# Patient Record
Sex: Female | Born: 1995 | Race: Black or African American | Hispanic: No | Marital: Single | Smoking: Never smoker
Health system: Southern US, Community
[De-identification: ages and names within clinical notes are randomized; demographics above are authoritative.]

## PROBLEM LIST (undated history)

## (undated) DIAGNOSIS — J45909 Unspecified asthma, uncomplicated: Secondary | ICD-10-CM

## (undated) DIAGNOSIS — F419 Anxiety disorder, unspecified: Secondary | ICD-10-CM

## (undated) HISTORY — DX: Anxiety disorder, unspecified: F41.9

---
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
## 2016-01-20 ENCOUNTER — Emergency Department (HOSPITAL_COMMUNITY)
Admission: EM | Admit: 2016-01-20 | Discharge: 2016-01-21 | Disposition: A | Discharge status: Home / Self Care | Payer: Medicaid Other | Attending: Emergency Medicine | Admitting: Emergency Medicine

## 2016-01-20 ENCOUNTER — Encounter (HOSPITAL_COMMUNITY): Payer: Self-pay | Admitting: Emergency Medicine

## 2016-01-20 ENCOUNTER — Emergency Department (HOSPITAL_COMMUNITY): Payer: Medicaid Other

## 2016-01-20 DIAGNOSIS — F1210 Cannabis abuse, uncomplicated: Secondary | ICD-10-CM | POA: Diagnosis not present

## 2016-01-20 DIAGNOSIS — G43809 Other migraine, not intractable, without status migrainosus: Secondary | ICD-10-CM | POA: Diagnosis not present

## 2016-01-20 DIAGNOSIS — G43109 Migraine with aura, not intractable, without status migrainosus: Secondary | ICD-10-CM

## 2016-01-20 DIAGNOSIS — J45909 Unspecified asthma, uncomplicated: Secondary | ICD-10-CM | POA: Insufficient documentation

## 2016-01-20 DIAGNOSIS — Z3202 Encounter for pregnancy test, result negative: Secondary | ICD-10-CM | POA: Diagnosis not present

## 2016-01-20 DIAGNOSIS — R202 Paresthesia of skin: Secondary | ICD-10-CM | POA: Diagnosis not present

## 2016-01-20 DIAGNOSIS — H538 Other visual disturbances: Secondary | ICD-10-CM

## 2016-01-20 DIAGNOSIS — Z79899 Other long term (current) drug therapy: Secondary | ICD-10-CM | POA: Insufficient documentation

## 2016-01-20 LAB — CBC
HCT: 39.6 % (ref 36.0–46.0)
Hemoglobin: 12.8 g/dL (ref 12.0–15.0)
MCH: 27.2 pg (ref 26.0–34.0)
MCHC: 32.3 g/dL (ref 30.0–36.0)
MCV: 84.1 fL (ref 78.0–100.0)
PLATELETS: 215 10*3/uL (ref 150–400)
RBC: 4.71 MIL/uL (ref 3.87–5.11)
RDW: 13.2 % (ref 11.5–15.5)
WBC: 7.5 10*3/uL (ref 4.0–10.5)

## 2016-01-20 LAB — I-STAT CHEM 8, ED
BUN: 13 mg/dL (ref 6–20)
CALCIUM ION: 1.11 mmol/L — AB (ref 1.12–1.23)
CHLORIDE: 103 mmol/L (ref 101–111)
Creatinine, Ser: 0.8 mg/dL (ref 0.44–1.00)
GLUCOSE: 97 mg/dL (ref 65–99)
HCT: 44 % (ref 36.0–46.0)
HEMOGLOBIN: 15 g/dL (ref 12.0–15.0)
Potassium: 3.5 mmol/L (ref 3.5–5.1)
SODIUM: 144 mmol/L (ref 135–145)
TCO2: 26 mmol/L (ref 0–100)

## 2016-01-20 LAB — DIFFERENTIAL
BASOS ABS: 0 10*3/uL (ref 0.0–0.1)
BASOS PCT: 0 %
Eosinophils Absolute: 0.1 10*3/uL (ref 0.0–0.7)
Eosinophils Relative: 1 %
LYMPHS PCT: 52 %
Lymphs Abs: 3.9 10*3/uL (ref 0.7–4.0)
Monocytes Absolute: 0.5 10*3/uL (ref 0.1–1.0)
Monocytes Relative: 7 %
NEUTROS ABS: 3 10*3/uL (ref 1.7–7.7)
NEUTROS PCT: 40 %

## 2016-01-20 LAB — I-STAT TROPONIN, ED: TROPONIN I, POC: 0 ng/mL (ref 0.00–0.08)

## 2016-01-20 LAB — I-STAT BETA HCG BLOOD, ED (MC, WL, AP ONLY): I-stat hCG, quantitative: <5 m[IU]/mL (ref <5)

## 2016-01-20 MED ORDER — SODIUM CHLORIDE 0.9 % IV BOLUS (SEPSIS)
1000.0000 mL | Freq: Once | INTRAVENOUS | Status: AC
  Administered 2016-01-21: 1000 mL via INTRAVENOUS

## 2016-01-20 MED ORDER — METOCLOPRAMIDE HCL 5 MG/ML IJ SOLN
10.0000 mg | Freq: Once | INTRAMUSCULAR | Status: AC
  Administered 2016-01-21: 10 mg via INTRAVENOUS
  Filled 2016-01-20: qty 2

## 2016-01-20 NOTE — ED Notes (Signed)
Pt to CT

## 2016-01-20 NOTE — ED Notes (Signed)
Pt c/o L side blurred vision, L arm and L leg numbness/tingling that started at 2150. Hx of migraines but sts this is different. No drift or facial droop noted. Airway cleared by Dr. Criss AlvineGoldston

## 2016-01-20 NOTE — ED Notes (Signed)
Code stroke paged @ 2317

## 2016-01-20 NOTE — ED Notes (Signed)
Neurologist at bedside. Pt taken to tx room

## 2016-01-20 NOTE — ED Provider Notes (Signed)
CSN: [id number]     Arrival date & time 01/20/16  2304 History   First MD Initiated Contact with Patient 01/20/16 2331     Chief Complaint  Patient presents with  . Code Stroke    Patricia Webster is a 20 y.o. female who presents to the ED complaining of her left field of vision being blurry since around 9 pm tonight with some left arm and left leg tingling that has become worse since her vision became blurry. Code stroke was called in triage. The patient reports a history of migraines reports this feels different. She denies any headache. She denies any double vision. She denies any trauma or injury to her head. She has taken nothing for treatment today. Patient reports she is been having some chronic ongoing nausea for the past several months that is unchanged. She denies any vomiting. Patient is on Nexplanon for birth control. She is currently on her menstrual cycle. She denies fevers, abdominal pain vomiting, diarrhea, double vision, neck pain, head injury, urinary symptoms, chest pain, or rashes.   The history is provided by the patient. No language interpreter was used.    Past Medical History  Diagnosis Date  . Asthma    History reviewed. No pertinent past surgical history. No family history on file. Social History  Substance Use Topics  . Smoking status: Never Smoker   . Smokeless tobacco: None  . Alcohol Use: No   OB History    No data available     Review of Systems  Constitutional: Negative for fever and chills.  HENT: Negative for congestion, hearing loss, sore throat and trouble swallowing.   Eyes: Positive for visual disturbance. Negative for pain and redness.  Respiratory: Negative for cough, shortness of breath and wheezing.   Cardiovascular: Negative for chest pain.  Gastrointestinal: Positive for nausea. Negative for vomiting, abdominal pain and diarrhea.  Genitourinary: Negative for dysuria.  Musculoskeletal: Negative for back pain, neck pain and neck stiffness.   Skin: Negative for rash.  Neurological: Negative for dizziness, seizures, syncope, facial asymmetry, speech difficulty, weakness, light-headedness, numbness and headaches.       Left arm and leg tingling.       Allergies  Review of patient's allergies indicates no known allergies.  Home Medications   Prior to Admission medications   Medication Sig Start Date End Date Taking? Authorizing Provider  albuterol (PROVENTIL HFA;VENTOLIN HFA) 108 (90 BASE) MCG/ACT inhaler Inhale 1-2 puffs into the lungs every 6 (six) hours as needed for wheezing. 08/24/13  Yes Linna Hoff, MD  etonogestrel (NEXPLANON) 68 MG IMPL implant 1 each by Subdermal route once.   Yes Historical Provider, MD  ibuprofen (ADVIL,MOTRIN) 600 MG tablet Take 1 tablet (600 mg total) by mouth every 6 (six) hours as needed for mild pain. 05/09/15  Yes Marcellina Millin, MD  ondansetron (ZOFRAN-ODT) 4 MG disintegrating tablet Take 1 tablet (4 mg total) by mouth every 8 (eight) hours as needed for nausea or vomiting. 05/09/15  Yes Marcellina Millin, MD  amitriptyline (ELAVIL) 25 MG tablet Take 1 tablet (25 mg total) by mouth at bedtime. 01/21/16   Everlene Farrier, PA-C   BP 119/76 mmHg  Pulse 71  Temp(Src) 98.5 F (36.9 C) (Oral)  Resp 16  Ht  (1.778 m)  Wt 101.152 kg  BMI 32.00 kg/m2  SpO2 100%  LMP 01/06/2016 Physical Exam  Constitutional: She is oriented to person, place, and time. She appears well-developed and well-nourished. No distress.  Nontoxic appearing.  HENT:  Head: Normocephalic and atraumatic.  Right Ear: External ear normal.  Left Ear: External ear normal.  Mouth/Throat: Oropharynx is clear and moist.  Eyes: Conjunctivae and EOM are normal. Pupils are equal, round, and reactive to light. Right eye exhibits no discharge. Left eye exhibits no discharge.  She reports a left field of vision is blurry in both eyes.   Neck: Normal range of motion. Neck supple. No JVD present. No tracheal deviation present.   Cardiovascular: Normal rate, regular rhythm, normal heart sounds and intact distal pulses.  Exam reveals no gallop and no friction rub.   No murmur heard. Bilateral radial and posterior tibialis pulses are intact.  Pulmonary/Chest: Effort normal and breath sounds normal. No respiratory distress. She has no wheezes. She has no rales.  Lungs clear to auscultation bilaterally.  Abdominal: Soft. She exhibits no distension. There is no tenderness. There is no guarding.  Abdomen is soft and nontender to palpation.  Musculoskeletal: Normal range of motion. She exhibits no edema.  Patient has 5 out of 5 strength in bilateral upper and lower extremities. Normal gait.  Lymphadenopathy:    She has no cervical adenopathy.  Neurological: She is alert and oriented to person, place, and time. No cranial nerve deficit. Coordination normal.  Patient is alert and oriented 3. EOMs are intact. She reports blurry left field of vision in both eyes. Cranial nerves are otherwise intact. Sensation is intact for bilateral upper and lower extremities. No pronator drift. Finger to nose intact bilaterally. Normal gait. Speech is clear and coherent.  Skin: Skin is warm and dry. No rash noted. She is not diaphoretic. No erythema. No pallor.  Psychiatric: She has a normal mood and affect. Her behavior is normal.  Nursing note and vitals reviewed.   ED Course  Procedures (including critical care time) Labs Review Labs Reviewed  COMPREHENSIVE METABOLIC PANEL - Abnormal; Notable for the following:    Glucose, Bld 102 (*)    All other components within normal limits  URINE RAPID DRUG SCREEN, HOSP PERFORMED - Abnormal; Notable for the following:    Tetrahydrocannabinol POSITIVE (*)    All other components within normal limits  URINALYSIS, ROUTINE W REFLEX MICROSCOPIC (NOT AT [facility]) - Abnormal; Notable for the following:    Hgb urine dipstick LARGE (*)    All other components within normal limits  URINE MICROSCOPIC-ADD  ON - Abnormal; Notable for the following:    Squamous Epithelial / LPF 0-5 (*)    Bacteria, UA RARE (*)    All other components within normal limits  I-STAT CHEM 8, ED - Abnormal; Notable for the following:    Calcium, Ion 1.11 (*)    All other components within normal limits  ETHANOL  PROTIME-INR  APTT  CBC  DIFFERENTIAL  I-STAT TROPOININ, ED  I-STAT BETA HCG BLOOD, ED ([facility], [facility], [facility] ONLY)    Imaging Review Ct Head Wo Contrast  01/20/2016  CLINICAL DATA:  Code stroke.  Left-sided numbness and blurry vision. EXAM: CT HEAD WITHOUT CONTRAST TECHNIQUE: Contiguous axial images were obtained from the base of the skull through the vertex without intravenous contrast. COMPARISON:  None. FINDINGS: No acute cortical infarct, hemorrhage, or mass lesion ispresent. Ventricles are of normal size. No significant extra-axial fluid collection is present. The paranasal sinuses andmastoid air cells are clear. The osseous skull is intact. The paranasal sinuses and mastoid air cells are clear. The calvarium is intact. IMPRESSION: Normal brain. Electronically Signed   By: Veronda Prudeaylor  Stroud M.D.  On: 01/20/2016 23:33   Mr Brain Wo Contrast  01/21/2016  CLINICAL DATA:  Initial evaluation for left-sided blurry vision. History of migraines. EXAM: MRI HEAD WITHOUT CONTRAST TECHNIQUE: Multiplanar, multiecho pulse sequences of the brain and surrounding structures were obtained without intravenous contrast. COMPARISON:  Prior CT from 01/20/2016. FINDINGS: The CSF containing spaces are within normal limits for patient age. No focal parenchymal signal abnormality is identified. No mass lesion, midline shift, or extra-axial fluid collection. Ventricles are normal in size without evidence of hydrocephalus. No diffusion-weighted signal abnormality is identified to suggest acute intracranial infarct. Gray-white matter differentiation is maintained. Normal flow voids are seen within the intracranial vasculature. No intracranial  hemorrhage identified. The cervicomedullary junction is normal. Pituitary gland is within normal limits. Pituitary stalk is midline. The globes and optic nerves demonstrate a normal appearance with normal signal intensity. The bone marrow signal intensity is normal. Calvarium is intact. Visualized upper cervical spine is within normal limits. Scalp soft tissues are unremarkable. Mild mucosal thickening throughout the paranasal sinuses. Small retention cyst within the right maxillary sinus. No mastoid effusion. Inner ear structures grossly normal. IMPRESSION: Normal brain MRI. No acute intracranial infarct or other process identified. Electronically Signed   By: Rise Mu M.D.   On: 01/21/2016 01:29   I have personally reviewed and evaluated these images and lab results as part of my medical decision-making.   EKG Interpretation   Date/Time:  Saturday January 20 2016 23:43:20 EST Ventricular Rate:  73 PR Interval:  181 QRS Duration: 78 QT Interval:  369 QTC Calculation: 407 R Axis:   48 Text Interpretation:  Sinus rhythm Confirmed by PICKERING  MD, NATHAN  [phone]) on 01/21/2016 1:42:46 AM      Filed Vitals:   01/20/16 2309 01/20/16 2352 01/20/16 2356 01/21/16 0125  BP: 117/87 119/76    Pulse: 79  71   Temp: 98.4 F (36.9 C)   98.5 F (36.9 C)  TempSrc: Oral     Resp: 18  16   Height: 5\' 10"  (1.778 m)     Weight: 101.152 kg     SpO2: 100%  100%      MDM   Meds given in ED:  Medications  metoCLOPramide (REGLAN) injection 10 mg (10 mg Intravenous Given 01/21/16 0118)  sodium chloride 0.9 % bolus 1,000 mL (1,000 mLs Intravenous New Bag/Given 01/21/16 0118)  LORazepam (ATIVAN) injection 1 mg (1 mg Intravenous Given 01/21/16 0134)    New Prescriptions   AMITRIPTYLINE (ELAVIL) 25 MG TABLET    Take 1 tablet (25 mg total) by mouth at bedtime.    Final diagnoses:  Blurry vision  Complicated migraine   This  is a 20 y.o. female who presents to the ED complaining of her left  field of vision being blurry since around 9 pm tonight with some left arm and left leg tingling that has become worse since her vision became blurry. Code stroke was called in triage. The patient reports a history of migraines reports this feels different. She denies any headache. She denies any double vision. She denies any trauma or injury to her head. On exam the patient is afebrile and nontoxic appearing. Lungs are clear to auscultation bilaterally. Abdomen is soft and nontender to palpation. She reports her left field of vision is blurry in her bilateral eyes. EOMs are intact.  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 Dr. Amada Jupiter would like an MRI of her brain and migraine cocktail. If MRI is unremarkable he  does not feel further workup is needed. He would send her home with Elavil 25 mg qhs and have her follow up with neurology as an outpatient. Patient is in agreement with plan for MRI.   Blood work is unremarkable. MRI is unremarkable.  Reevaluation patient reports she is feeling better. Her vision is back to normal. She denies any numbness, tingling or weakness. Will discharge with prescription for Elavil 25 mg daily at bedtime per recommendation of neurology. I encouraged her to follow-up with neurology and primary care. I advised the patient to follow-up with their primary care provider this week. I advised the patient to return to the emergency department with new or worsening symptoms or new concerns. The patient verbalized understanding and agreement with plan.    This patient was discussed with Dr. Rubin Payor who agrees with assessment and plan.   Everlene Farrier, PA-C 01/21/16 0147  Benjiman Core, MD 01/21/16 [id number]

## 2016-01-21 ENCOUNTER — Emergency Department (HOSPITAL_COMMUNITY): Payer: Medicaid Other

## 2016-01-21 DIAGNOSIS — R202 Paresthesia of skin: Secondary | ICD-10-CM | POA: Diagnosis not present

## 2016-01-21 LAB — URINE MICROSCOPIC-ADD ON: WBC, UA: NONE SEEN WBC/hpf (ref 0–5)

## 2016-01-21 LAB — COMPREHENSIVE METABOLIC PANEL
ALT: 19 U/L (ref 14–54)
AST: 23 U/L (ref 15–41)
Albumin: 3.7 g/dL (ref 3.5–5.0)
Alkaline Phosphatase: 54 U/L (ref 38–126)
Anion gap: 12 (ref 5–15)
BUN: 13 mg/dL (ref 6–20)
CHLORIDE: 105 mmol/L (ref 101–111)
CO2: 26 mmol/L (ref 22–32)
CREATININE: 0.84 mg/dL (ref 0.44–1.00)
Calcium: 9.3 mg/dL (ref 8.9–10.3)
Glucose, Bld: 102 mg/dL — ABNORMAL HIGH (ref 65–99)
POTASSIUM: 3.7 mmol/L (ref 3.5–5.1)
Sodium: 143 mmol/L (ref 135–145)
Total Bilirubin: 0.6 mg/dL (ref 0.3–1.2)
Total Protein: 7.4 g/dL (ref 6.5–8.1)

## 2016-01-21 LAB — URINALYSIS, ROUTINE W REFLEX MICROSCOPIC
Bilirubin Urine: NEGATIVE
GLUCOSE, UA: NEGATIVE mg/dL
KETONES UR: NEGATIVE mg/dL
LEUKOCYTES UA: NEGATIVE
Nitrite: NEGATIVE
PH: 6 (ref 5.0–8.0)
Protein, ur: NEGATIVE mg/dL
Specific Gravity, Urine: 1.024 (ref 1.005–1.030)

## 2016-01-21 LAB — APTT: APTT: 27 s (ref 24–37)

## 2016-01-21 LAB — RAPID URINE DRUG SCREEN, HOSP PERFORMED
Amphetamines: NOT DETECTED
Barbiturates: NOT DETECTED
Benzodiazepines: NOT DETECTED
COCAINE: NOT DETECTED
OPIATES: NOT DETECTED
TETRAHYDROCANNABINOL: POSITIVE — AB

## 2016-01-21 LAB — PROTIME-INR
INR: 0.94 (ref 0.00–1.49)
PROTHROMBIN TIME: 12.8 s (ref 11.6–15.2)

## 2016-01-21 LAB — ETHANOL

## 2016-01-21 MED ORDER — LORAZEPAM 2 MG/ML IJ SOLN
1.0000 mg | Freq: Once | INTRAMUSCULAR | Status: AC
  Administered 2016-01-21: 1 mg via INTRAVENOUS
  Filled 2016-01-21: qty 1

## 2016-01-21 MED ORDER — AMITRIPTYLINE HCL 25 MG PO TABS: 25.0000 mg | ORAL_TABLET | Freq: Every day | ORAL | Status: DC

## 2016-01-21 NOTE — Consult Note (Signed)
Neurology Consultation Reason for Consult: Numbness Referring Physician: Ruel FavorsPickering, N  CC: Numbness, blurred vision  History is obtained from: Patient  HPI: Patricia Webster is a 20 y.o. female with a history of migraines who was in her normal state of health earlier this evening. Around 10 PM she began having blurred vision to the left and both eyes. States that this is improved somewhat. Around the same time that her vision started, she began noticing tingling in her left face and then arm and then subsequently spread to her leg. This has continued and is still present. She denies any weakness.  She is getting frequent migraines, with headaches every other day or so.  Tonight she does have implantable birth control.  She denies headache or photophobia.  LKW: 2150 tpa given?: no, mild symptoms Premorbid modified rankin scale: 0    ROS: A 14 point ROS was performed and is negative except as noted in the HPI.    Past Medical History  Diagnosis Date  . Asthma      Family history: No history of similar   Social History:  reports that she has never smoked. She does not have any smokeless tobacco history on file. She reports that she does not drink alcohol. Her drug history is not on file.   Exam: Current vital signs: BP 119/76 mmHg  Pulse 71  Temp(Src) 98.4 F (36.9 C) (Oral)  Resp 16  Ht 5\' 10"  (1.778 m)  Wt 101.152 kg (223 lb)  BMI 32.00 kg/m2  SpO2 100%  LMP 01/06/2016 Vital signs in last 24 hours: Temp:  [98.4 F (36.9 C)] 98.4 F (36.9 C) (03/04 2309) Pulse Rate:  [71-79] 71 (03/04 2356) Resp:  [16-18] 16 (03/04 2356) BP: (117-119)/(76-87) 119/76 mmHg (03/04 2352) SpO2:  [100 %] 100 % (03/04 2356) Weight:  [101.152 kg (223 lb)] 101.152 kg (223 lb) (03/04 2309)   Physical Exam  Constitutional: Appears well-developed and well-nourished.  Psych: Affect appropriate to situation Eyes: No scleral injection HENT: No OP obstrucion Head: Normocephalic.   Cardiovascular: Normal rate and regular rhythm.  Respiratory: Effort normal and breath sounds normal to anterior ascultation GI: Soft.  No distension. There is no tenderness.  Skin: WDI  Neuro: Mental Status: Patient is awake, alert, oriented to person, place, month, year, and situation. Patient is able to give a clear and coherent history. No signs of aphasia or neglect Cranial Nerves: II: Visual Fields are full. Pupils are equal, round, and reactive to light.   III,IV, VI: EOMI without ptosis or diploplia.  V: Facial sensation is diminished on the left light touch VII: Facial movement is symmetric.  VIII: hearing is intact to voice X: Uvula elevates symmetrically XI: Shoulder shrug is symmetric. XII: tongue is midline without atrophy or fasciculations.  Motor: Tone is normal. Bulk is normal. 5/5 strength was present in all four extremities.  Sensory: Sensation is symmetric to light touch and temperature in the arms and legs. Cerebellar: FNF and HKS are intact bilaterally   I have reviewed labs in epic and the results pertinent to this consultation are: Chem 8-unremarkable  I have reviewed the images obtained: CT head-negative  Impression: 20 year old female with blurred vision on the left as well as paresthesias. The spreading nature as well as positive symptoms may be think that migraine is more likely than ischemic stroke. The fact that she is on birth control would make her at slightly higher risk, however, and she does not have a headache and therefore I would  recommend further imaging with MRI.  Recommendations: 1) MRI without contrast 2) if negative, treat as migraine. 3) could use nortriptyline 25 mg daily at bedtime for preventive.   Ritta Slot, MD [facility] [phone]  If 7pm- 7am, please page neurology on call as listed in AMION.

## 2016-01-21 NOTE — Progress Notes (Signed)
Code stroke called on 20 y.o female with a history of migraines, currently with implanted birth control. Around 10 pm she began having blurred vision to both eyes along with tingling and decreased sensation in left face arm and leg. She denies head ache. LSN 2150. Upon my arrival Pt complaining of numbness and "hot" feeling on left side of face. CT scan competed STAT, negative per Neurologist Dr. Amada JupiterKirkpatrick. Labs completed. NIHSS done yielding 1 for sensory. Patient for ED observation until MRI results. No treatment for stroke at this time.

## 2016-01-21 NOTE — Discharge Instructions (Signed)
Migraine Headache A migraine headache is an intense, throbbing pain on one or both sides of your head. A migraine can last for 30 minutes to several hours. CAUSES  The exact cause of a migraine headache is not always known. However, a migraine may be caused when nerves in the brain become irritated and release chemicals that cause inflammation. This causes pain. Certain things may also trigger migraines, such as:  Alcohol.  Smoking.  Stress.  Menstruation.  Aged cheeses.  Foods or drinks that contain nitrates, glutamate, aspartame, or tyramine.  Lack of sleep.  Chocolate.  Caffeine.  Hunger.  Physical exertion.  Fatigue.  Medicines used to treat chest pain (nitroglycerine), birth control pills, estrogen, and some blood pressure medicines. SIGNS AND SYMPTOMS  Pain on one or both sides of your head.  Pulsating or throbbing pain.  Severe pain that prevents daily activities.  Pain that is aggravated by any physical activity.  Nausea, vomiting, or both.  Dizziness.  Pain with exposure to bright lights, loud noises, or activity.  General sensitivity to bright lights, loud noises, or smells. Before you get a migraine, you may get warning signs that a migraine is coming (aura). An aura may include:  Seeing flashing lights.  Seeing bright spots, halos, or zigzag lines.  Having tunnel vision or blurred vision.  Having feelings of numbness or tingling.  Having trouble talking.  Having muscle weakness. DIAGNOSIS  A migraine headache is often diagnosed based on:  Symptoms.  Physical exam.  A CT scan or MRI of your head. These imaging tests cannot diagnose migraines, but they can help rule out other causes of headaches. TREATMENT Medicines may be given for pain and nausea. Medicines can also be given to help prevent recurrent migraines.  HOME CARE INSTRUCTIONS  Only take over-the-counter or prescription medicines for pain or discomfort as directed by your  health care provider. The use of long-term narcotics is not recommended.  Lie down in a dark, quiet room when you have a migraine.  Keep a journal to find out what may trigger your migraine headaches. For example, write down:  What you eat and drink.  How much sleep you get.  Any change to your diet or medicines.  Limit alcohol consumption.  Quit smoking if you smoke.  Get 7-9 hours of sleep, or as recommended by your health care provider.  Limit stress.  Keep lights dim if bright lights bother you and make your migraines worse. SEEK IMMEDIATE MEDICAL CARE IF:   Your migraine becomes severe.  You have a fever.  You have a stiff neck.  You have vision loss.  You have muscular weakness or loss of muscle control.  You start losing your balance or have trouble walking.  You feel faint or pass out.  You have severe symptoms that are different from your first symptoms. MAKE SURE YOU:   Understand these instructions.  Will watch your condition.  Will get help right away if you are not doing well or get worse.   This information is not intended to replace advice given to you by your health care provider. Make sure you discuss any questions you have with your health care provider.   Document Released: 11/04/2005 Document Revised: 11/25/2014 Document Reviewed: 07/12/2013 Elsevier Interactive Patient Education 2016 [employer]. Substance Abuse Treatment Programs  Intensive Outpatient Programs [facility]     [address]      [place], [place]  [phone]       [facility] [address] [place], [place] [phone]  [facility]     (Inpatient and outpatient)     [address]           [phone]    [facility] [phone] (Suboxone and Methadone)  [address]      [place], [place] [address]      [phone]       [address]  [address] [place], [place] [phone]  [facility] (Outpatient/Inpatient, [facility])    (insurance only) [phone]             [facility] (Groups & Residential) [place], [place] [phone]     [facility]     [address]     [place], [place]      [phone]       [facility] (for caregivers and family) [phone] [address] [place], [place] [phone]      [facility]idential [facility]      [address], [place], [place] [address]  [phone]       [facility] [address] [place], [place] [address] [phone]  [facility]        [phone]       [facility] [phone]  [facility] ([facility]             [address]                                         [place], [place]                                                [phone] or [phone]                               [facility] [place] [address] [place], [address] [phone]  [facility]    [address]      [place], [place]     [phone]       [facility] [address] [place], [place] [phone]  [facility]   [address]     [place], [place] [address]     [phone]      Admissions: 8am-3pm M-F  [facility] ([facility]) [address] [place], [place] [phone]  [facility]: Residential Program [phone](90 Days)   [place], [place]      [phone] or [phone]     [facility]: [facility] [place], [place] (Walk in Hours over the weekend or by referral)  [facility] [address] [place], [place], [place] [address] [phone]  Crisis Mobile: [facility]:  [phone] (for crisis response 24 hours a day) [facility] Hotline:      [phone] Outpatient Psychiatry and Counseling  [facility]: Mobile Crisis Management 24 hours:   [phone]  [facility] sliding scale fee and walk in schedule: M-F 8am-12pm/1pm-3pm [address]  [place], [place] [address] [phone]  [facility] [address] [place], [place] [address] [phone]  [facility] (Formerly known as  [facility])- new patient walk-in appointments available Monday - Friday 8am -3pm.          [address] [place], [place] [address] [phone] or crisis line- [facility]/ Intensive Outpatient Therapy Program [place], [place] [address] [address]      [phone] [address] [place], [place] [address]                 [place]   [facility] [phone]. [place], [place] [address]   [employer] [facility]          [address] [facility] Abraham  [facility], [place] [address]       [phone]  [facility] [address] [place], [place] [address] [phone]  [facility]    [address] [place], [place] [address]     [place], [place] Joycelyn Man     [place] [address]     [phone]       [facility] [place] [address]  [facility]     [address] [place], [place], [place] Co[place], [place] [address] [place]     [address]     [place], [place] [address]     [phone]       [facility] [address] [place], [place] [address] [phone] Resources for Temporary Residential Assistance/Crisis [place] [facility]) M-F 8am-3pm   [address] [place], [place] [address]   [phone] Services include: laundry, barbering, support groups, case management, phone  & computer access, showers, AA/NA mtgs, mental health/substance abuse nurse, job skills class, disability information, VA assistance, spiritual classes, etc.   [facility]   [address] [place]     [place]              [employer] (women and children)       [place]. [place], [place] [address] [phone] [email or web address] for application and process Application Required  [facility]   [address]    [place] [address]     [phone]                    [facility] [place], [place] [address] [id number] application appt.) Application Required  [facility] (women only)    [address]     [place],  [address]     [phone]      Intake starts 6pm daily Need valid ID, SSC, & Police report [facility] [address] [place],  [id number] Application Required  [employer] (men only)     [place].      [place],  [place]     [phone]       [facility] (Pregnant women only) [address] [place], [place]  [facility]      [place] [address].      [place], [place] [address]     [phone]             [facility] [address] [place], [place] 90 day commitment/SA/Application process  [facility](men only)     [address]     [place], [place]       Check-in at [facility] [address] [place], [place] [address] [phone] Men/Women/Women and Children must be there by 7 pm  [place], [place]

## 2016-02-05 ENCOUNTER — Other Ambulatory Visit (HOSPITAL_COMMUNITY)
Admission: RE | Admit: 2016-02-05 | Discharge: 2016-02-05 | Disposition: A | Discharge status: Home / Self Care | Payer: Medicaid Other | Source: Ambulatory Visit | Attending: Medical | Admitting: Medical

## 2016-02-05 ENCOUNTER — Encounter: Payer: Self-pay | Admitting: Medical

## 2016-02-05 ENCOUNTER — Ambulatory Visit (INDEPENDENT_AMBULATORY_CARE_PROVIDER_SITE_OTHER): Payer: Medicaid Other | Admitting: Medical

## 2016-02-05 VITALS — BP 120/74 | HR 80 | Ht 69.0 in | Wt 224.0 lb

## 2016-02-05 DIAGNOSIS — Z01419 Encounter for gynecological examination (general) (routine) without abnormal findings: Secondary | ICD-10-CM

## 2016-02-05 DIAGNOSIS — Z0000 Encounter for general adult medical examination without abnormal findings: Secondary | ICD-10-CM | POA: Diagnosis not present

## 2016-02-05 DIAGNOSIS — Z113 Encounter for screening for infections with a predominantly sexual mode of transmission: Secondary | ICD-10-CM | POA: Insufficient documentation

## 2016-02-05 NOTE — Patient Instructions (Signed)
Sexually Transmitted Disease °A sexually transmitted disease (STD) is a disease or infection that may be passed (transmitted) from person to person, usually during sexual activity. This may happen by way of saliva, semen, blood, vaginal mucus, or urine. Common STDs include: °· Gonorrhea. °· Chlamydia. °· Syphilis. °· HIV and AIDS. °· Genital herpes. °· Hepatitis B and C. °· Trichomonas. °· Human papillomavirus (HPV). °· Pubic lice. °· Scabies. °· Mites. °· Bacterial vaginosis. °WHAT ARE CAUSES OF STDs? °An STD may be caused by bacteria, a virus, or parasites. STDs are often transmitted during sexual activity if one person is infected. However, they may also be transmitted through nonsexual means. STDs may be transmitted after:  °· Sexual intercourse with an infected person. °· Sharing sex toys with an infected person. °· Sharing needles with an infected person or using unclean piercing or tattoo needles. °· Having intimate contact with the genitals, mouth, or rectal areas of an infected person. °· Exposure to infected fluids during birth. °WHAT ARE THE SIGNS AND SYMPTOMS OF STDs? °Different STDs have different symptoms. Some people may not have any symptoms. If symptoms are present, they may include: °· Painful or bloody urination. °· Pain in the pelvis, abdomen, vagina, anus, throat, or eyes. °· A skin rash, itching, or irritation. °· Growths, ulcerations, blisters, or sores in the genital and anal areas. °· Abnormal vaginal discharge with or without bad odor. °· Penile discharge in men. °· Fever. °· Pain or bleeding during sexual intercourse. °· Swollen glands in the groin area. °· Yellow skin and eyes (jaundice). This is seen with hepatitis. °· Swollen testicles. °· Infertility. °· Sores and blisters in the mouth. °HOW ARE STDs DIAGNOSED? °To make a diagnosis, your health care provider may: °· Take a medical history. °· Perform a physical exam. °· Take a sample of any discharge to examine. °· Swab the throat,  cervix, opening to the penis, rectum, or vagina for testing. °· Test a sample of your first morning urine. °· Perform blood tests. °· Perform a Pap test, if this applies. °· Perform a colposcopy. °· Perform a laparoscopy. °HOW ARE STDs TREATED? °Treatment depends on the STD. Some STDs may be treated but not cured. °· Chlamydia, gonorrhea, trichomonas, and syphilis can be cured with antibiotic medicine. °· Genital herpes, hepatitis, and HIV can be treated, but not cured, with prescribed medicines. The medicines lessen symptoms. °· Genital warts from HPV can be treated with medicine or by freezing, burning (electrocautery), or surgery. Warts may come back. °· HPV cannot be cured with medicine or surgery. However, abnormal areas may be removed from the cervix, vagina, or vulva. °· If your diagnosis is confirmed, your recent sexual partners need treatment. This is true even if they are symptom-free or have a negative culture or evaluation. They should not have sex until their health care providers say it is okay. °· Your health care provider may test you for infection again 3 months after treatment. °HOW CAN I REDUCE MY RISK OF GETTING AN STD? °Take these steps to reduce your risk of getting an STD: °· Use latex condoms, dental dams, and water-soluble lubricants during sexual activity. Do not use petroleum jelly or oils. °· Avoid having multiple sex partners. °· Do not have sex with someone who has other sex partners °· Do not have sex with anyone you do not know or who is at high risk for an STD. °· Avoid risky sex practices that can break your skin. °· Do not have sex   if you have open sores on your mouth or skin. °· Avoid drinking too much alcohol or taking illegal drugs. Alcohol and drugs can affect your judgment and put you in a vulnerable position. °· Avoid engaging in oral and anal sex acts. °· Get vaccinated for HPV and hepatitis. If you have not received these vaccines in the past, talk to your health care  provider about whether one or both might be right for you. °· If you are at risk of being infected with HIV, it is recommended that you take a prescription medicine daily to prevent HIV infection. This is called pre-exposure prophylaxis (PrEP). You are considered at risk if: °¨ You are a man who has sex with other men (MSM). °¨ You are a heterosexual man or woman and are sexually active with more than one partner. °¨ You take drugs by injection. °¨ You are sexually active with a partner who has HIV. °· Talk with your health care provider about whether you are at high risk of being infected with HIV. If you choose to begin PrEP, you should first be tested for HIV. You should then be tested every 3 months for as long as you are taking PrEP. °WHAT SHOULD I DO IF I THINK I HAVE AN STD? °· See your health care provider. °· Tell your sexual partner(s). They should be tested and treated for any STDs. °· Do not have sex until your health care provider says it is okay. °WHEN SHOULD I GET IMMEDIATE MEDICAL CARE? °Contact your health care provider right away if:  °· You have severe abdominal pain. °· You are a man and notice swelling or pain in your testicles. °· You are a woman and notice swelling or pain in your vagina. °  °This information is not intended to replace advice given to you by your health care provider. Make sure you discuss any questions you have with your health care provider. °  °Document Released: 01/25/2003 Document Revised: 11/25/2014 Document Reviewed: 05/25/2013 °Elsevier Interactive Patient Education ©2016 Elsevier Inc. ° °

## 2016-02-05 NOTE — Progress Notes (Signed)
Patient ID: Patricia Webster, female   DOB: 02/01/1996, 20 y.o.   MRN: [id number] Subjective:    Patricia Webster is a 20 y.o. female who presents for an annual exam. The patient has no complaints today. The patient is sexually active. GYN screening history: no prior history of gyn screening tests. The patient wears seatbelts: yes. The patient participates in regular exercise: no. Has the patient ever been transfused or tattooed?: not asked. The patient reports that there is not domestic violence in her life. Patient states a history of Chlamydia. She and partner were treated, she has not had TOC. Patient is still with partner. Nexplanon in place for birth control.   Menstrual History: OB History    Gravida Para Term Preterm AB TAB SAB Ectopic Multiple Living   0 0 0 0 0 0 0 0 0 0       Patient's last menstrual period was 01/06/2016.    The following portions of the patient's history were reviewed and updated as appropriate: allergies, current medications, past family history, past medical history, past social history, past surgical history and problem list.  Review of Systems Pertinent items are noted in HPI.    Objective:     Physical Exam  Constitutional: She is oriented to person, place, and time. She appears well-developed and well-nourished. No distress.  HENT:  Head: Normocephalic.  Cardiovascular: Normal rate.   Respiratory: Effort normal.  GI: Soft. She exhibits no distension and no mass. There is no tenderness. There is no rebound and no guarding.  Genitourinary: Uterus is not enlarged and not tender. Cervix exhibits no motion tenderness, no discharge and no friability. Right adnexum displays no mass and no tenderness. Left adnexum displays no mass and no tenderness. No bleeding in the vagina. Vaginal discharge (scant thin, white discharge noted) found.  Neurological: She is alert and oriented to person, place, and time.  Skin: Skin is warm and dry. No erythema.  Psychiatric: She has a  normal mood and affect.    .    Assessment:    Healthy female exam.   STD testing   Plan:     Wet prep  and GC/Chlamydia obtained HIV, RPR, Hep B and Hep C drawn today Patient will be contacted with any abnormal results Follow-up with [facility] as needed or at age 20 years for pap smear  Marny LowensteinJulie N Kelly Ranieri, PA-C 02/05/2016 3:41 PM

## 2016-02-06 LAB — WET PREP, GENITAL
Trich, Wet Prep: NONE SEEN
WBC, Wet Prep HPF POC: NONE SEEN
Yeast Wet Prep HPF POC: NONE SEEN

## 2016-02-06 LAB — GC/CHLAMYDIA PROBE AMP (~~LOC~~) NOT AT ARMC
CHLAMYDIA, DNA PROBE: NEGATIVE
Neisseria Gonorrhea: NEGATIVE

## 2016-02-06 LAB — HIV ANTIBODY (ROUTINE TESTING W REFLEX): HIV 1&2 Ab, 4th Generation: NONREACTIVE

## 2016-02-06 LAB — RPR

## 2016-02-06 LAB — HEPATITIS C ANTIBODY: HCV AB: NEGATIVE

## 2016-02-06 LAB — HEPATITIS B SURFACE ANTIGEN: HEP B S AG: NEGATIVE

## 2016-04-05 ENCOUNTER — Encounter (HOSPITAL_COMMUNITY): Payer: Self-pay

## 2016-04-05 DIAGNOSIS — R0602 Shortness of breath: Secondary | ICD-10-CM | POA: Diagnosis present

## 2016-04-05 DIAGNOSIS — Z3202 Encounter for pregnancy test, result negative: Secondary | ICD-10-CM | POA: Insufficient documentation

## 2016-04-05 DIAGNOSIS — J45901 Unspecified asthma with (acute) exacerbation: Secondary | ICD-10-CM | POA: Insufficient documentation

## 2016-04-05 DIAGNOSIS — R002 Palpitations: Secondary | ICD-10-CM | POA: Insufficient documentation

## 2016-04-05 MED ORDER — ALBUTEROL SULFATE (2.5 MG/3ML) 0.083% IN NEBU
5.0000 mg | INHALATION_SOLUTION | Freq: Once | RESPIRATORY_TRACT | Status: AC
  Administered 2016-04-05: 5 mg via RESPIRATORY_TRACT

## 2016-04-05 MED ORDER — ALBUTEROL SULFATE (2.5 MG/3ML) 0.083% IN NEBU
INHALATION_SOLUTION | RESPIRATORY_TRACT | Status: AC
  Administered 2016-04-05: 5 mg via RESPIRATORY_TRACT
  Filled 2016-04-05: qty 6

## 2016-04-05 NOTE — ED Notes (Addendum)
Patient come from home complains of shortness of breath.  States she has been having problems with this for the past few weeks.  States her chest gets tight and its hard to breathe.  Has not used her inhaler for a while now.  Patient A&Ox4. Pt appears very anxious at triage.

## 2016-04-06 ENCOUNTER — Emergency Department (HOSPITAL_COMMUNITY)
Admission: EM | Admit: 2016-04-06 | Discharge: 2016-04-06 | Disposition: A | Discharge status: Home / Self Care | Payer: Medicaid Other | Attending: Emergency Medicine | Admitting: Emergency Medicine

## 2016-04-06 ENCOUNTER — Emergency Department (HOSPITAL_COMMUNITY): Payer: Medicaid Other

## 2016-04-06 DIAGNOSIS — R002 Palpitations: Secondary | ICD-10-CM

## 2016-04-06 LAB — I-STAT BETA HCG BLOOD, ED (MC, WL, AP ONLY): I-stat hCG, quantitative: <5 m[IU]/mL (ref <5)

## 2016-04-06 NOTE — ED Notes (Signed)
Patient transported to X-ray 

## 2016-04-06 NOTE — ED Notes (Signed)
Pt verbalized understanding to follow up with cardiologist and has no further questions. Pt stable and NAD. Pt ambulatory.

## 2016-04-06 NOTE — ED Provider Notes (Signed)
CSN: [id number]     Arrival date & time 04/05/16  2215 History   First MD Initiated Contact with Patient 04/06/16 0037     Chief Complaint  Patient presents with  . Shortness of Breath     (Consider location/radiation/quality/duration/timing/severity/associated sxs/prior Treatment) HPI   Patient with PMH of asthma and anxiety comes to the ER with her significant other with multiple complaints. She says that she has been dealing with SOB for years. This has not changed but she did have any episode today. She also reports hx of palpitations or heart racing which she has had evaluated but has been told her EKG is normal. She reports 3 days of intermittent chest tightness, with heart racing and anxiety that resolves on its own after a few minutes. She was having these symptoms prior to arrival but they resolved in its own. Denies that breathing treatment helped but endorses it made her feel jittery.  Past Medical History  Diagnosis Date  . Asthma    History reviewed. No pertinent past surgical history. History reviewed. No pertinent family history. Social History  Substance Use Topics  . Smoking status: Never Smoker   . Smokeless tobacco: None  . Alcohol Use: No   OB History    Gravida Para Term Preterm AB TAB SAB Ectopic Multiple Living       Review of Systems  Review of Systems All other systems negative except as documented in the HPI. All pertinent positives and negatives as reviewed in the HPI.   Allergies  Review of patient's allergies indicates no known allergies.  Home Medications   Prior to Admission medications   Medication Sig Start Date End Date Taking? Authorizing Provider  etonogestrel (NEXPLANON) 68 MG IMPL implant 1 each by Subdermal route once. Reported on 02/05/2016   Yes Historical Provider, MD  albuterol (PROVENTIL HFA;VENTOLIN HFA) 108 (90 BASE) MCG/ACT inhaler Inhale 1-2 puffs into the lungs every 6 (six) hours as needed for  wheezing. Patient not taking: Reported on 02/05/2016 08/24/13   Linna Hoff, MD  amitriptyline (ELAVIL) 25 MG tablet Take 1 tablet (25 mg total) by mouth at bedtime. Patient not taking: Reported on 02/05/2016 01/21/16   Everlene Farrier, PA-C  ibuprofen (ADVIL,MOTRIN) 600 MG tablet Take 1 tablet (600 mg total) by mouth every 6 (six) hours as needed for mild pain. Patient not taking: Reported on 02/05/2016 05/09/15   Marcellina Millin, MD  ondansetron (ZOFRAN-ODT) 4 MG disintegrating tablet Take 1 tablet (4 mg total) by mouth every 8 (eight) hours as needed for nausea or vomiting. Patient not taking: Reported on 02/05/2016 05/09/15   Marcellina Millin, MD   BP 109/71 mmHg  Pulse 81  Temp(Src) 98.2 F (36.8 C) (Oral)  Resp 18  SpO2 100% Physical Exam  Constitutional: She appears well-developed and well-nourished. No distress.  HENT:  Head: Normocephalic and atraumatic.  Right Ear: Tympanic membrane and ear canal normal.  Left Ear: Tympanic membrane and ear canal normal.  Nose: Nose normal.  Mouth/Throat: Uvula is midline, oropharynx is clear and moist and mucous membranes are normal.  Eyes: Pupils are equal, round, and reactive to light.  Neck: Normal range of motion. Neck supple.  Cardiovascular: Normal rate and regular rhythm.   Pulmonary/Chest: Effort normal.  Abdominal: Soft.  No signs of abdominal distention  Musculoskeletal:  No LE swelling  Neurological: She is alert.  Acting at baseline  Skin: Skin is warm and dry. No rash  noted.  Nursing note and vitals reviewed.   ED Course  Procedures (including critical care time) Labs Review Labs Reviewed  I-STAT BETA HCG BLOOD, ED ([facility], [facility], [facility] ONLY)    Imaging Review Dg Chest 2 View  04/06/2016  CLINICAL DATA:  20 year old female with shortness of breath EXAM: CHEST  2 VIEW COMPARISON:  None. FINDINGS: The heart size and mediastinal contours are within normal limits. Both lungs are clear. The visualized skeletal structures are unremarkable.  IMPRESSION: No active cardiopulmonary disease. Electronically Signed   By: Elgie CollardArash  Radparvar M.D.   On: 04/06/2016 02:22   I have personally reviewed and evaluated these images and lab results as part of my medical decision-making.   EKG Interpretation   Date/Time:  Saturday Apr 06 2016 01:01:25 EDT Ventricular Rate:  83 PR Interval:  178 QRS Duration: 76 QT Interval:  363 QTC Calculation: 426 R Axis:   23 Text Interpretation:  Sinus rhythm No significant change since last  tracing Confirmed by WARD,  DO, KRISTEN ([id number]) on 04/06/2016 1:12:54 AM      MDM   Final diagnoses:  Palpitations   Patient has normal vital signs. Her symptoms would be very atypical for PE and I have low suspicion for this.  Her EKG is unremarkable, neg hcg i-stat and chest xray is unremarkable. Patient symptoms are likely due to anxiety but will refer to cardiology to consider for holter monitor.  She denies chest pain, LE swelling, syncope.  Medications  albuterol (PROVENTIL) (2.5 MG/3ML) 0.083% nebulizer solution 5 mg (5 mg Nebulization Given 04/05/16 2225)   I discussed results, diagnoses and plan with [place]. They voice there understanding and questions were answered. We discussed follow-up recommendations and return precautions.   Marlon Peliffany Braven Wolk, PA-C 04/06/16 0249  Layla MawKristen N Ward, DO 04/06/16 [id number]

## 2016-04-06 NOTE — Discharge Instructions (Signed)
Holter Monitoring  A Holter monitor is a small device that is used to detect abnormal heart rhythms. It clips to your clothing and is connected by wires to flat, sticky disks (electrodes) that attach to your chest. It is worn continuously for 24-48 hours.  HOME CARE INSTRUCTIONS   Wear your Holter monitor at all times, even while exercising and sleeping, for as long as directed by your health care provider.   Make sure that the Holter monitor is safely clipped to your clothing or close to your body as recommended by your health care provider.   Do not get the monitor or wires wet.   Do not put body lotion or moisturizer on your chest.   Keep your skin clean.   Keep a diary of your daily activities, such as walking and doing chores. If you feel that your heartbeat is abnormal or that your heart is fluttering or skipping a beat:    Record what you are doing when it happens.    Record what time of day the symptoms occur.   Return your Holter monitor as directed by your health care provider.   Keep all follow-up visits as directed by your health care provider. This is important.  SEEK IMMEDIATE MEDICAL CARE IF:   You feel lightheaded or you faint.   You have trouble breathing.   You feel pain in your chest, upper arm, or jaw.   You feel sick to your stomach and your skin is pale, cool, or damp.   You heartbeat feels unusual or abnormal.     This information is not intended to replace advice given to you by your health care provider. Make sure you discuss any questions you have with your health care provider.     Document Released: 08/02/2004 Document Revised: 11/25/2014 Document Reviewed: 06/13/2014  Elsevier Interactive Patient Education 2016 Elsevier Inc.  Palpitations  A palpitation is the feeling that your heartbeat is irregular or is faster than normal. It may feel like your heart is fluttering or skipping a beat. Palpitations are usually not a serious problem. However, in some cases, you may need  further medical evaluation.  CAUSES   Palpitations can be caused by:   Smoking.   Caffeine or other stimulants, such as diet pills or energy drinks.   Alcohol.   Stress and anxiety.   Strenuous physical activity.   Fatigue.   Certain medicines.   Heart disease, especially if you have a history of irregular heart rhythms (arrhythmias), such as atrial fibrillation, atrial flutter, or supraventricular tachycardia.   An improperly working pacemaker or defibrillator.  DIAGNOSIS   To find the cause of your palpitations, your health care provider will take your medical history and perform a physical exam. Your health care provider may also have you take a test called an ambulatory electrocardiogram (ECG). An ECG records your heartbeat patterns over a 24-hour period. You may also have other tests, such as:   Transthoracic echocardiogram (TTE). During echocardiography, sound waves are used to evaluate how blood flows through your heart.   Transesophageal echocardiogram (TEE).   Cardiac monitoring. This allows your health care provider to monitor your heart rate and rhythm in real time.   Holter monitor. This is a portable device that records your heartbeat and can help diagnose heart arrhythmias. It allows your health care provider to track your heart activity for several days, if needed.   Stress tests by exercise or by giving medicine that makes the heart beat faster.    TREATMENT   Treatment of palpitations depends on the cause of your symptoms and can vary greatly. Most cases of palpitations do not require any treatment other than time, relaxation, and monitoring your symptoms. Other causes, such as atrial fibrillation, atrial flutter, or supraventricular tachycardia, usually require further treatment.  HOME CARE INSTRUCTIONS    Avoid:    Caffeinated coffee, tea, soft drinks, diet pills, and energy drinks.    Chocolate.    Alcohol.   Stop smoking if you smoke.   Reduce your stress and anxiety. Things that  can help you relax include:    A method of controlling things in your body, such as your heartbeats, with your mind (biofeedback).    Yoga.    Meditation.    Physical activity such as swimming, jogging, or walking.   Get plenty of rest and sleep.  SEEK MEDICAL CARE IF:    You continue to have a fast or irregular heartbeat beyond 24 hours.   Your palpitations occur more often.  SEEK IMMEDIATE MEDICAL CARE IF:   You have chest pain or shortness of breath.   You have a severe headache.   You feel dizzy or you faint.  MAKE SURE YOU:   Understand these instructions.   Will watch your condition.   Will get help right away if you are not doing well or get worse.     This information is not intended to replace advice given to you by your health care provider. Make sure you discuss any questions you have with your health care provider.     Document Released: 11/01/2000 Document Revised: 11/09/2013 Document Reviewed: 01/03/2012  Elsevier Interactive Patient Education 2016 Elsevier Inc.

## 2016-05-07 ENCOUNTER — Ambulatory Visit (INDEPENDENT_AMBULATORY_CARE_PROVIDER_SITE_OTHER): Payer: Medicaid Other | Admitting: Internal Medicine

## 2016-05-07 ENCOUNTER — Encounter: Payer: Self-pay | Admitting: Internal Medicine

## 2016-05-07 VITALS — BP 120/67 | HR 79 | Temp 98.7°F | Ht 69.0 in | Wt 232.0 lb

## 2016-05-07 DIAGNOSIS — F411 Generalized anxiety disorder: Secondary | ICD-10-CM

## 2016-05-07 MED ORDER — CITALOPRAM HYDROBROMIDE 10 MG PO TABS: 10.0000 mg | ORAL_TABLET | Freq: Every day | ORAL | Status: DC

## 2016-05-07 NOTE — Assessment & Plan Note (Addendum)
No hx of anxiety. GAD score of 17, some difficult with work and interaction with people. PHQ9 3. Exacerbated by recent hx of MJ uses, however patient has since stopped this for 3 months. Patient with compliant of daily symptoms of anxiety. Hx of early death of mother in 5740s, patient afraid there is something wrong with her similar to her mother.  - start low dose citalopram (CELEXA) 10 MG tablet; Take 1 tablet (10 mg total) by mouth daily.  - Provide therapist recommendations, discussed that this might help patient  - Follow up in 3 weeks  - Consider increasing dose of Celexa at that time.

## 2016-05-07 NOTE — Patient Instructions (Addendum)
Please follow up with me in 3 weeks to discuss your anxiety and to determine if celexa is helping with your anxiety. Please try to contact therapist and talk with them.

## 2016-05-07 NOTE — Progress Notes (Signed)
   [facility] Noralee CharsAsiyah Cyla Haluska, MD Phone: [phone]  Reason For Visit:  Establish care visit   # Anxiety-  No hx of anxiety.  Patient indicates feeling anxious every day. She is worried something is wrong with her. Her mother passed away in her 5340s and patient is worried that she ill. Patient had  a severe panic attack in April. She had been smoking MJ several times a week to help relax, however this time she states she suddenly felt SOB. During this panic attack, patient felt like she was going to die. Her boyfriend help her pace her breathing and she improved. Patient now has a fear of panic attacks. She is also is very anxious and feels that her breathing is not normal of some level. She often feels SOB at night, usually at rest, sometimes she has palpations with this. She states since March she has been having episodes of SOB daily. Feels like it is due to anxiety. Used albuterol, helped the best. Comforting from boyfriend. Patient copes with SOB by sitting still or drink water. Patient stopped drinking, smoking MJ  In April of this year because of the SOB. No hx of syncope, no leg swelling. No hx of sudden death in 6720s.   # Asthma,  - Used albuterol inhaler about 4 times last year. Patient diagnosised with asthma in sophomore year when playing softball. No hx of asthma exacerbations as child or that she knows of. No recent hx of albuterol use. Has tried albuterol inhaler when SOB at times without much improvement. Denies any hx of cough or congestion.  Menstrual Periods- irregular due to nexplanon  Sexual active- Nexplanon, boyfriend, feel safes with her relationship. Does not use condoms. Understands that this is protective against STDs. Does have past hx of chlamydia   Past Medical History Reviewed problem list.  Medications- reviewed and updated No additions to family history Social history- patient is a non-smoker  Objective: BP 120/67 mmHg  Pulse 79  Temp(Src)  98.7 F (37.1 C) (Oral)  Ht 5\' 9"  (1.753 m)  Wt 232 lb (105.235 kg)  BMI 34.24 kg/m2 Gen: NAD, alert, cooperative with exam HEENT: NCAT, EOMI, PERRL, TMs nml Neck: FROM, supple CV: RRR, good S1/S2, no murmur, radial pulses 2+  Resp: CTABL, no wheezes, non-labored Abd: SNTND, BS present, no guarding or organomegaly Ext: No edema, warm, normal tone, moves UE/LE spontaneously Neuro: Alert and oriented, No gross deficits Skin: no rashes no lesions  Assessment/Plan: See problem based a/p  Anxiety state No hx of anxiety. GAD score of 17, some difficult with work and interaction with people. PHQ9 3. Exacerbated by recent hx of MJ uses, however patient has since stopped this for 3 months. Patient with compliant of daily symptoms of anxiety. Hx of early death of mother in 1540s, patient afraid there is something wrong with her similar to her mother.  - start low dose citalopram (CELEXA) 10 MG tablet; Take 1 tablet (10 mg total) by mouth daily.  - Provide therapist recommendations, discussed that this might help patient  - Follow up in 3 weeks  - Consider increasing dose of Celexa at that time.

## 2016-08-05 ENCOUNTER — Other Ambulatory Visit: Payer: Self-pay | Admitting: Internal Medicine

## 2016-08-05 MED ORDER — ALBUTEROL SULFATE HFA 108 (90 BASE) MCG/ACT IN AERS: 1.0000 | INHALATION_SPRAY | Freq: Four times a day (QID) | RESPIRATORY_TRACT | 1 refills | Status: DC | PRN

## 2016-08-05 NOTE — Telephone Encounter (Signed)
Needs refill on albuterol inhaler.  [facility] on [address]

## 2016-08-05 NOTE — Addendum Note (Signed)
Addended by: Noralee CharsMIKELL, Morgaine Kimball Z on: 08/05/2016 06:09 PM   Modules accepted: Orders

## 2016-08-23 ENCOUNTER — Encounter (HOSPITAL_COMMUNITY): Payer: Self-pay | Admitting: Vascular Surgery

## 2016-08-23 ENCOUNTER — Encounter (HOSPITAL_COMMUNITY): Payer: Self-pay | Admitting: Emergency Medicine

## 2016-08-23 ENCOUNTER — Ambulatory Visit (HOSPITAL_COMMUNITY)
Admission: EM | Admit: 2016-08-23 | Discharge: 2016-08-23 | Disposition: A | Discharge status: Nursing Home (Includes ICF) | Payer: Medicaid Other | Attending: Family Medicine | Admitting: Family Medicine

## 2016-08-23 ENCOUNTER — Inpatient Hospital Stay (HOSPITAL_COMMUNITY)
Admission: EM | Admit: 2016-08-23 | Discharge: 2016-08-25 | DRG: 872 | Disposition: A | Discharge status: Home / Self Care | Payer: Medicaid Other | Attending: Internal Medicine | Admitting: Internal Medicine

## 2016-08-23 ENCOUNTER — Emergency Department (HOSPITAL_COMMUNITY): Payer: Medicaid Other

## 2016-08-23 DIAGNOSIS — Z833 Family history of diabetes mellitus: Secondary | ICD-10-CM

## 2016-08-23 DIAGNOSIS — R51 Headache: Secondary | ICD-10-CM | POA: Diagnosis not present

## 2016-08-23 DIAGNOSIS — G039 Meningitis, unspecified: Secondary | ICD-10-CM | POA: Diagnosis present

## 2016-08-23 DIAGNOSIS — A879 Viral meningitis, unspecified: Secondary | ICD-10-CM | POA: Diagnosis present

## 2016-08-23 DIAGNOSIS — G4700 Insomnia, unspecified: Secondary | ICD-10-CM | POA: Diagnosis present

## 2016-08-23 DIAGNOSIS — A419 Sepsis, unspecified organism: Secondary | ICD-10-CM | POA: Diagnosis present

## 2016-08-23 DIAGNOSIS — F411 Generalized anxiety disorder: Secondary | ICD-10-CM | POA: Diagnosis present

## 2016-08-23 DIAGNOSIS — J45909 Unspecified asthma, uncomplicated: Secondary | ICD-10-CM | POA: Diagnosis present

## 2016-08-23 DIAGNOSIS — Z823 Family history of stroke: Secondary | ICD-10-CM

## 2016-08-23 DIAGNOSIS — R519 Headache, unspecified: Secondary | ICD-10-CM

## 2016-08-23 LAB — URINALYSIS, ROUTINE W REFLEX MICROSCOPIC
BILIRUBIN URINE: NEGATIVE
Glucose, UA: NEGATIVE mg/dL
Hgb urine dipstick: NEGATIVE
Ketones, ur: 40 mg/dL — AB
Leukocytes, UA: NEGATIVE
NITRITE: NEGATIVE
PROTEIN: NEGATIVE mg/dL
pH: 6 (ref 5.0–8.0)

## 2016-08-23 LAB — COMPREHENSIVE METABOLIC PANEL
ALT: 19 U/L (ref 14–54)
AST: 21 U/L (ref 15–41)
Albumin: 3.6 g/dL (ref 3.5–5.0)
Alkaline Phosphatase: 52 U/L (ref 38–126)
Anion gap: 7 (ref 5–15)
BUN: 8 mg/dL (ref 6–20)
CHLORIDE: 107 mmol/L (ref 101–111)
CO2: 22 mmol/L (ref 22–32)
Calcium: 8.9 mg/dL (ref 8.9–10.3)
Creatinine, Ser: 0.8 mg/dL (ref 0.44–1.00)
Glucose, Bld: 99 mg/dL (ref 65–99)
POTASSIUM: 4 mmol/L (ref 3.5–5.1)
Sodium: 136 mmol/L (ref 135–145)
Total Bilirubin: 0.8 mg/dL (ref 0.3–1.2)
Total Protein: 7 g/dL (ref 6.5–8.1)

## 2016-08-23 LAB — CBC WITH DIFFERENTIAL/PLATELET
BASOS PCT: 0 %
Basophils Absolute: 0 10*3/uL (ref 0.0–0.1)
EOS ABS: 0 10*3/uL (ref 0.0–0.7)
Eosinophils Relative: 0 %
HCT: 41.9 % (ref 36.0–46.0)
HEMOGLOBIN: 13.5 g/dL (ref 12.0–15.0)
Lymphocytes Relative: 23 %
Lymphs Abs: 2.2 10*3/uL (ref 0.7–4.0)
MCH: 27.4 pg (ref 26.0–34.0)
MCHC: 32.2 g/dL (ref 30.0–36.0)
MCV: 85 fL (ref 78.0–100.0)
Monocytes Absolute: 0.7 10*3/uL (ref 0.1–1.0)
Monocytes Relative: 7 %
NEUTROS PCT: 70 %
Neutro Abs: 6.6 10*3/uL (ref 1.7–7.7)
Platelets: 226 10*3/uL (ref 150–400)
RBC: 4.93 MIL/uL (ref 3.87–5.11)
RDW: 13.2 % (ref 11.5–15.5)
WBC: 9.4 10*3/uL (ref 4.0–10.5)

## 2016-08-23 LAB — I-STAT BETA HCG BLOOD, ED (MC, WL, AP ONLY)

## 2016-08-23 LAB — I-STAT CG4 LACTIC ACID, ED: Lactic Acid, Venous: 0.91 mmol/L (ref 0.5–1.9)

## 2016-08-23 MED ORDER — LIDOCAINE HCL 1 % IJ SOLN
INTRAMUSCULAR | Status: AC
  Filled 2016-08-23: qty 10

## 2016-08-23 MED ORDER — ACETAMINOPHEN 325 MG PO TABS
650.0000 mg | ORAL_TABLET | Freq: Once | ORAL | Status: AC
  Administered 2016-08-23: 650 mg via ORAL

## 2016-08-23 MED ORDER — ONDANSETRON HCL 4 MG/2ML IJ SOLN
INTRAMUSCULAR | Status: AC
  Filled 2016-08-23: qty 2

## 2016-08-23 MED ORDER — DEXAMETHASONE SODIUM PHOSPHATE 10 MG/ML IJ SOLN
10.0000 mg | Freq: Once | INTRAMUSCULAR | Status: AC
  Administered 2016-08-23: 10 mg via INTRAVENOUS
  Filled 2016-08-23: qty 1

## 2016-08-23 MED ORDER — LIDOCAINE HCL (PF) 1 % IJ SOLN
INTRAMUSCULAR | Status: AC
  Filled 2016-08-23: qty 5

## 2016-08-23 MED ORDER — DIPHENHYDRAMINE HCL 50 MG/ML IJ SOLN
25.0000 mg | Freq: Once | INTRAMUSCULAR | Status: AC
  Administered 2016-08-23: 25 mg via INTRAVENOUS
  Filled 2016-08-23: qty 1

## 2016-08-23 MED ORDER — ONDANSETRON HCL 4 MG/2ML IJ SOLN: 4.0000 mg | Freq: Once | INTRAMUSCULAR | Status: DC

## 2016-08-23 MED ORDER — SODIUM CHLORIDE 0.9 % IV BOLUS (SEPSIS)
1000.0000 mL | Freq: Once | INTRAVENOUS | Status: AC
  Administered 2016-08-23: 1000 mL via INTRAVENOUS

## 2016-08-23 MED ORDER — METOCLOPRAMIDE HCL 5 MG/ML IJ SOLN
10.0000 mg | Freq: Once | INTRAMUSCULAR | Status: AC
  Administered 2016-08-23: 10 mg via INTRAVENOUS
  Filled 2016-08-23: qty 2

## 2016-08-23 MED ORDER — KETOROLAC TROMETHAMINE 30 MG/ML IJ SOLN
30.0000 mg | Freq: Once | INTRAMUSCULAR | Status: AC
  Administered 2016-08-23: 30 mg via INTRAVENOUS
  Filled 2016-08-23: qty 1

## 2016-08-23 MED ORDER — ONDANSETRON 4 MG PO TBDP
ORAL_TABLET | ORAL | Status: AC
  Filled 2016-08-23: qty 1

## 2016-08-23 MED ORDER — ONDANSETRON 4 MG PO TBDP
4.0000 mg | ORAL_TABLET | Freq: Once | ORAL | Status: AC
  Administered 2016-08-23: 4 mg via ORAL

## 2016-08-23 MED ORDER — ACETAMINOPHEN 325 MG PO TABS
ORAL_TABLET | ORAL | Status: AC
  Filled 2016-08-23: qty 2

## 2016-08-23 MED ORDER — KETOROLAC TROMETHAMINE 30 MG/ML IJ SOLN: 30.0000 mg | Freq: Once | INTRAMUSCULAR | Status: DC

## 2016-08-23 NOTE — ED Notes (Signed)
EDP at bedside  

## 2016-08-23 NOTE — ED Provider Notes (Signed)
[facility]    CSN: [id number] Arrival date & time: 08/23/16  1239     History   Chief Complaint Chief Complaint  Patient presents with  . Headache    HPI Patricia Webster is a 20 y.o. female.   The history is provided by the patient and a friend.  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 Worsened by:  Nothing Ineffective treatments:  None tried Associated symptoms: nausea, neck pain and neck stiffness   Associated symptoms: no blurred vision, no dizziness, no near-syncope, no numbness and no vomiting     Past Medical History:  Diagnosis Date  . Anxiety   . Asthma     Patient Active Problem List   Diagnosis Date Noted  . Anxiety state 05/07/2016    History reviewed. No pertinent surgical history.  OB History    Gravida Para Term Preterm AB Living   0 0 0 0 0 0   SAB TAB Ectopic Multiple Live Births   0 0 0 0         Home Medications    Prior to Admission medications   Medication Sig Start Date End Date Taking? Authorizing Provider  albuterol (PROVENTIL HFA;VENTOLIN HFA) 108 (90 Base) MCG/ACT inhaler Inhale 1-2 puffs into the lungs every 6 (six) hours as needed for wheezing. 08/05/16   Asiyah Mayra Reel, MD  citalopram (CELEXA) 10 MG tablet Take 1 tablet (10 mg total) by mouth daily. 05/07/16   Asiyah Mayra Reel, MD  etonogestrel (NEXPLANON) 68 MG IMPL implant 1 each by Subdermal route once. Reported on 02/05/2016    Historical Provider, MD    Family History Family History  Problem Relation Age of Onset  . Early death Mother   . Stroke Maternal Grandmother   . Diabetes Maternal Uncle     Social History Social History  Substance Use Topics  . Smoking status: Never  Smoker  . Smokeless tobacco: Never Used  . Alcohol use No     Allergies   Review of patient's allergies indicates no known allergies.   Review of Systems Review of Systems  Eyes: Negative for blurred vision.  Cardiovascular: Negative for near-syncope.  Gastrointestinal: Positive for nausea. Negative for vomiting.  Musculoskeletal: Positive for neck pain and neck stiffness.  Neurological: Positive for headaches. Negative for dizziness, speech difficulty, light-headedness and numbness.  All other systems reviewed and are negative.    Physical Exam Triage Vital Signs ED Triage Vitals  Enc Vitals Group     BP 08/23/16 1254 106/60     Pulse --      Resp 08/23/16 1254 12     Temp 08/23/16 1254 99.7 F (37.6 C)     Temp Source 08/23/16 1254 Oral     SpO2 08/23/16 1254 98 %     Weight --      Height --      Head Circumference --      Peak Flow --      Pain Score 08/23/16 1311 10     Pain Loc --      Pain Edu? --      Excl. in GC? --    No data found.   Updated Vital  Signs BP 106/60 (BP Location: Left Arm)   Temp 99.7 F (37.6 C) (Oral)   Resp 12   SpO2 98%   Visual Acuity Right Eye Distance:   Left Eye Distance:   Bilateral Distance:    Right Eye Near:   Left Eye Near:    Bilateral Near:     Physical Exam  Constitutional: She appears well-developed and well-nourished. She appears distressed.  HENT:  Head: Normocephalic and atraumatic.  Right Ear: External ear normal.  Left Ear: External ear normal.  Eyes: Conjunctivae and EOM are normal. Pupils are equal, round, and reactive to light.  Neck: Muscular tenderness present. Neck rigidity present. Decreased range of motion present. No Brudzinski's sign and no Kernig's sign noted.    Nursing note and vitals reviewed.    UC Treatments / Results  Labs (all labs ordered are listed, but only abnormal results are displayed) Labs Reviewed - No data to display  EKG  EKG Interpretation None        Radiology No results found.  Procedures Procedures (including critical care time)  Medications Ordered in UC Medications - No data to display   Initial Impression / Assessment and Plan / UC Course  I have reviewed the triage vital signs and the nursing notes.  Pertinent labs & imaging results that were available during my care of the patient were reviewed by me and considered in my medical decision making (see chart for details).  Clinical Course      Final Clinical Impressions(s) / UC Diagnoses   Final diagnoses:  None    New Prescriptions New Prescriptions   No medications on file     Linna HoffJames D Geral Coker, MD 08/23/16 1344

## 2016-08-23 NOTE — ED Provider Notes (Signed)
[facility]-EMERGENCY DEPT Provider Note   CSN: [id number] Arrival date & time: 08/23/16  1357     History   Chief Complaint Chief Complaint  Patient presents with  . Headache    HPI Patricia Webster is a 20 y.o. female.  Patient presents today with a chief complaint of headache, fever, pain in her back and her neck, and neck stiffness.  She reports onset of headache yesterday morning.  Headache is progressively worsening.  She has taken Ibuprofen for the headache without improvement.  She reports that she has never had a headache like this before.  She reports that last night she felt febrile, but never took her temperature.  Temp is 101.4 F upon arrival in the ED.  She denies any cough, SOB, sore throat, abdominal pain, urinary symptoms, or any other symptoms. She is unsure if she has had the meningococcal vaccine.  No known exposure to meningitis.  She attends school at [employer].      Past Medical History:  Diagnosis Date  . Anxiety   . Asthma     Patient Active Problem List   Diagnosis Date Noted  . Anxiety state 05/07/2016    History reviewed. No pertinent surgical history.  OB History    Gravida Para Term Preterm AB Living   0 0 0 0 0 0   SAB TAB Ectopic Multiple Live Births   0 0 0 0         Home Medications    Prior to Admission medications   Medication Sig Start Date End Date Taking? Authorizing Provider  albuterol (PROVENTIL HFA;VENTOLIN HFA) 108 (90 Base) MCG/ACT inhaler Inhale 1-2 puffs into the lungs every 6 (six) hours as needed for wheezing. 08/05/16   Asiyah Mayra Reel, MD  citalopram (CELEXA) 10 MG tablet Take 1 tablet (10 mg total) by mouth daily. 05/07/16   Asiyah Mayra Reel, MD  etonogestrel (NEXPLANON) 68 MG IMPL implant 1 each by Subdermal route once. Reported on 02/05/2016    Historical Provider, MD    Family History Family History  Problem Relation Age of Onset  . Early death Mother   . Stroke Maternal Grandmother   . Diabetes Maternal Uncle      Social History Social History  Substance Use Topics  . Smoking status: Never Smoker  . Smokeless tobacco: Never Used  . Alcohol use No     Allergies   Review of patient's allergies indicates no known allergies.   Review of Systems Review of Systems  All other systems reviewed and are negative.    Physical Exam Updated Vital Signs BP (!) 100/52 (BP Location: Left Arm)   Pulse 108   Temp 99.2 F (37.3 C) (Oral)   Resp 20   SpO2 100%   Physical Exam  Constitutional: She appears well-developed and well-nourished.  HENT:  Head: Normocephalic and atraumatic.  Mouth/Throat: Oropharynx is clear and moist.  Eyes: EOM are normal. Pupils are equal, round, and reactive to light.  Neck: Neck rigidity present. Decreased range of motion present. No edema and no erythema present. No Brudzinski's sign and no Kernig's sign noted.  Cardiovascular: Normal rate, regular rhythm and normal heart sounds.   Pulmonary/Chest: Effort normal and breath sounds normal.  Neurological: She is alert. She has normal strength. No cranial nerve deficit or sensory deficit. GCS eye subscore is 4. GCS verbal subscore is 5. GCS motor subscore is 6.  Skin: Skin is warm and dry.  Psychiatric: She has a normal mood and affect.  Nursing note and vitals reviewed.    ED Treatments / Results  Labs (all labs ordered are listed, but only abnormal results are displayed) Labs Reviewed  URINE CULTURE  URINE CULTURE  COMPREHENSIVE METABOLIC PANEL  CBC WITH DIFFERENTIAL/PLATELET  URINALYSIS, ROUTINE W REFLEX MICROSCOPIC (NOT AT [facility])  I-STAT BETA HCG BLOOD, ED ([facility], [facility], [facility] ONLY)  I-STAT CG4 LACTIC ACID, ED  I-STAT CG4 LACTIC ACID, ED    EKG  EKG Interpretation None       Radiology No results found.  Procedures Procedures (including critical care time)  Medications Ordered in ED Medications  sodium chloride 0.9 % bolus 1,000 mL (not administered)  metoCLOPramide (REGLAN) injection 10 mg (not  administered)  diphenhydrAMINE (BENADRYL) injection 25 mg (not administered)  dexamethasone (DECADRON) injection 10 mg (not administered)  acetaminophen (TYLENOL) tablet 650 mg (650 mg Oral Given 08/23/16 1413)     Initial Impression / Assessment and Plan / ED Course  I have reviewed the triage vital signs and the nursing notes.  Pertinent labs & imaging results that were available during my care of the patient were reviewed by me and considered in my medical decision making (see chart for details).  Clinical Course   10:58 PM Discussed with Dr Andria MeuseStevens with Radiology.  He states that he will be able to do the Fluroscopic LP.  Final Clinical Impressions(s) / ED Diagnoses   Final diagnoses:  None   Patient presents today with HA, fever, and neck pain/stiffness.  Onset of symptoms yesterday morning. Presentation concerning for Meningitis.  LP attempted by Dr Anitra LauthPlunkett, but was unsuccessful.  LP then performed by Radiology under Fluoro.  CSF results pending at time of shift change.  Patient signed out to Marlon Peliffany Greene, PA-C at shift change who will follow up on the results of the CSF.     New Prescriptions New Prescriptions   No medications on file     Santiago GladHeather Martyna Thorns, Cordelia Poche-C 08/25/16 2146    Gwyneth SproutWhitney Plunkett, MD 08/25/16 2333

## 2016-08-23 NOTE — ED Triage Notes (Signed)
Pt reports to the ED for eval of HAs. The HA started yesterday and she was taking medication which helped a little yesterday but it would hurt again when the meds wore off. She reports today she continued to have a HA. [facility]. Pt reports pain is worse with bright light and sound. Pt also reports stiff neck from where she has been trying to keep it straight.

## 2016-08-23 NOTE — ED Notes (Signed)
Pt declined ondansetron IM... Per Dr. Artis FlockKindl, ok to give 4 mg ondansetron ODT

## 2016-08-23 NOTE — ED Triage Notes (Signed)
Pt c/o constant HA onset yest associated w/stiff neck  Pain increases w/movement, bright light and loud noises  Denies fevers  A&O x4... NAD

## 2016-08-24 ENCOUNTER — Encounter (HOSPITAL_COMMUNITY): Payer: Self-pay | Admitting: General Practice

## 2016-08-24 DIAGNOSIS — G030 Nonpyogenic meningitis: Secondary | ICD-10-CM | POA: Diagnosis not present

## 2016-08-24 DIAGNOSIS — A419 Sepsis, unspecified organism: Secondary | ICD-10-CM | POA: Diagnosis present

## 2016-08-24 DIAGNOSIS — Z823 Family history of stroke: Secondary | ICD-10-CM | POA: Diagnosis not present

## 2016-08-24 DIAGNOSIS — R7309 Other abnormal glucose: Secondary | ICD-10-CM | POA: Diagnosis not present

## 2016-08-24 DIAGNOSIS — G039 Meningitis, unspecified: Secondary | ICD-10-CM | POA: Diagnosis not present

## 2016-08-24 DIAGNOSIS — G4700 Insomnia, unspecified: Secondary | ICD-10-CM | POA: Diagnosis present

## 2016-08-24 DIAGNOSIS — J4520 Mild intermittent asthma, uncomplicated: Secondary | ICD-10-CM | POA: Diagnosis not present

## 2016-08-24 DIAGNOSIS — F411 Generalized anxiety disorder: Secondary | ICD-10-CM | POA: Diagnosis not present

## 2016-08-24 DIAGNOSIS — F329 Major depressive disorder, single episode, unspecified: Secondary | ICD-10-CM | POA: Insufficient documentation

## 2016-08-24 DIAGNOSIS — A879 Viral meningitis, unspecified: Secondary | ICD-10-CM | POA: Diagnosis present

## 2016-08-24 DIAGNOSIS — J45909 Unspecified asthma, uncomplicated: Secondary | ICD-10-CM | POA: Diagnosis present

## 2016-08-24 DIAGNOSIS — Z833 Family history of diabetes mellitus: Secondary | ICD-10-CM | POA: Diagnosis not present

## 2016-08-24 DIAGNOSIS — F32A Depression, unspecified: Secondary | ICD-10-CM | POA: Insufficient documentation

## 2016-08-24 DIAGNOSIS — R51 Headache: Secondary | ICD-10-CM | POA: Diagnosis present

## 2016-08-24 LAB — BASIC METABOLIC PANEL
ANION GAP: 6 (ref 5–15)
BUN: 9 mg/dL (ref 6–20)
CHLORIDE: 112 mmol/L — AB (ref 101–111)
CO2: 20 mmol/L — AB (ref 22–32)
Calcium: 8.7 mg/dL — ABNORMAL LOW (ref 8.9–10.3)
Creatinine, Ser: 0.6 mg/dL (ref 0.44–1.00)
GFR calc non Af Amer: >60 mL/min (ref >60)
Glucose, Bld: 149 mg/dL — ABNORMAL HIGH (ref 65–99)
Potassium: 4 mmol/L (ref 3.5–5.1)
Sodium: 138 mmol/L (ref 135–145)

## 2016-08-24 LAB — CSF CELL COUNT WITH DIFFERENTIAL
Eosinophils, CSF: 1 % (ref 0–1)
Lymphs, CSF: 72 % (ref 40–80)
Lymphs, CSF: 74 % (ref 40–80)
Monocyte-Macrophage-Spinal Fluid: 16 % (ref 15–45)
Monocyte-Macrophage-Spinal Fluid: 16 % (ref 15–45)
RBC COUNT CSF: 385 /mm3 — AB
RBC COUNT CSF: 40 /mm3 — AB
SEGMENTED NEUTROPHILS-CSF: 10 % — AB (ref 0–6)
SEGMENTED NEUTROPHILS-CSF: 11 % — AB (ref 0–6)
TUBE #: 4
Tube #: 1
WBC CSF: 285 /mm3 — AB (ref 0–5)
WBC CSF: 355 /mm3 — AB (ref 0–5)

## 2016-08-24 LAB — CBC
HCT: 40.4 % (ref 36.0–46.0)
HEMOGLOBIN: 12.9 g/dL (ref 12.0–15.0)
MCH: 27.2 pg (ref 26.0–34.0)
MCHC: 31.9 g/dL (ref 30.0–36.0)
MCV: 85.1 fL (ref 78.0–100.0)
Platelets: 205 10*3/uL (ref 150–400)
RBC: 4.75 MIL/uL (ref 3.87–5.11)
RDW: 13.1 % (ref 11.5–15.5)
WBC: 6.9 10*3/uL (ref 4.0–10.5)

## 2016-08-24 LAB — HIV ANTIBODY (ROUTINE TESTING W REFLEX): HIV Screen 4th Generation wRfx: NONREACTIVE

## 2016-08-24 LAB — GLUCOSE, CSF: GLUCOSE CSF: 50 mg/dL (ref 40–70)

## 2016-08-24 LAB — PROTIME-INR
INR: 1.11
PROTHROMBIN TIME: 14.3 s (ref 11.4–15.2)

## 2016-08-24 LAB — LACTIC ACID, PLASMA
LACTIC ACID, VENOUS: 0.8 mmol/L (ref 0.5–1.9)
Lactic Acid, Venous: 0.9 mmol/L (ref 0.5–1.9)

## 2016-08-24 LAB — HCG, QUANTITATIVE, PREGNANCY: hCG, Beta Chain, Quant, S: 1 m[IU]/mL (ref <5)

## 2016-08-24 LAB — PROCALCITONIN: Procalcitonin: <0.1 ng/mL

## 2016-08-24 LAB — APTT: aPTT: 28 seconds (ref 24–36)

## 2016-08-24 LAB — PROTEIN, CSF: TOTAL PROTEIN, CSF: 59 mg/dL — AB (ref 15–45)

## 2016-08-24 MED ORDER — ALPRAZOLAM 0.25 MG PO TABS: 0.2500 mg | ORAL_TABLET | Freq: Two times a day (BID) | ORAL | Status: DC | PRN

## 2016-08-24 MED ORDER — ACETAMINOPHEN 325 MG PO TABS
650.0000 mg | ORAL_TABLET | Freq: Four times a day (QID) | ORAL | Status: DC | PRN
  Administered 2016-08-24 (×2): 650 mg via ORAL
  Filled 2016-08-24 (×2): qty 2

## 2016-08-24 MED ORDER — ENOXAPARIN SODIUM 40 MG/0.4ML ~~LOC~~ SOLN: 40.0000 mg | SUBCUTANEOUS | Status: DC

## 2016-08-24 MED ORDER — SODIUM CHLORIDE 0.9 % IV BOLUS (SEPSIS)
1000.0000 mL | Freq: Once | INTRAVENOUS | Status: AC
  Administered 2016-08-24: 1000 mL via INTRAVENOUS

## 2016-08-24 MED ORDER — IBUPROFEN 400 MG PO TABS: 400.0000 mg | ORAL_TABLET | Freq: Four times a day (QID) | ORAL | Status: DC | PRN

## 2016-08-24 MED ORDER — ONDANSETRON HCL 4 MG/2ML IJ SOLN: 4.0000 mg | Freq: Three times a day (TID) | INTRAMUSCULAR | Status: DC | PRN

## 2016-08-24 MED ORDER — DEXTROSE 5 % IV SOLN
750.0000 mg | Freq: Once | INTRAVENOUS | Status: AC
  Administered 2016-08-24: 750 mg via INTRAVENOUS
  Filled 2016-08-24: qty 15

## 2016-08-24 MED ORDER — ENOXAPARIN SODIUM 40 MG/0.4ML ~~LOC~~ SOLN
40.0000 mg | SUBCUTANEOUS | Status: DC
  Filled 2016-08-24: qty 0.4

## 2016-08-24 MED ORDER — ALPRAZOLAM 0.25 MG PO TABS
0.2500 mg | ORAL_TABLET | Freq: Three times a day (TID) | ORAL | Status: DC | PRN
  Administered 2016-08-24: 0.25 mg via ORAL
  Filled 2016-08-24: qty 1

## 2016-08-24 MED ORDER — SODIUM CHLORIDE 0.9% FLUSH: 3.0000 mL | Freq: Two times a day (BID) | INTRAVENOUS | Status: DC

## 2016-08-24 MED ORDER — DEXAMETHASONE SODIUM PHOSPHATE 10 MG/ML IJ SOLN: 10.0000 mg | Freq: Once | INTRAMUSCULAR | Status: DC

## 2016-08-24 MED ORDER — DEXTROSE 5 % IV SOLN: 2.0000 g | INTRAVENOUS | Status: DC

## 2016-08-24 MED ORDER — ALBUTEROL SULFATE (2.5 MG/3ML) 0.083% IN NEBU: 3.0000 mL | INHALATION_SOLUTION | Freq: Four times a day (QID) | RESPIRATORY_TRACT | Status: DC | PRN

## 2016-08-24 MED ORDER — VANCOMYCIN HCL IN DEXTROSE 1-5 GM/200ML-% IV SOLN: 1000.0000 mg | Freq: Once | INTRAVENOUS | Status: DC

## 2016-08-24 MED ORDER — DEXTROSE 5 % IV SOLN
750.0000 mg | Freq: Three times a day (TID) | INTRAVENOUS | Status: DC
  Administered 2016-08-24 – 2016-08-25 (×4): 750 mg via INTRAVENOUS
  Filled 2016-08-24 (×7): qty 15

## 2016-08-24 MED ORDER — SODIUM CHLORIDE 0.9 % IV SOLN
INTRAVENOUS | Status: DC
  Administered 2016-08-24: 1000 mL via INTRAVENOUS

## 2016-08-24 NOTE — ED Notes (Signed)
Tiffany PA aware of CSF results

## 2016-08-24 NOTE — ED Provider Notes (Signed)
Patient CSF shows elevated WBC, mildly elevated protein and normal glucose.  Discussed results with Dr. Jolayne HainesJ.Hatcher, likely viral meningitis but he recommends treating with antivirals until viral CSF tests return. These were not ordered initially and have been added on.  Does not recommend abx at this time.  I spoke with Dr. Clyde LundborgNiu who has agreed to admit the patient, inpatient, [facility], [facility], tele   Marlon Peliffany Simcha Farrington, PA-C 08/24/16 [id number]    Gilda Creasehristopher J Pollina, MD 08/24/16 2330

## 2016-08-24 NOTE — ED Notes (Signed)
Called main lab to add on HSV and VDRL to CSF.

## 2016-08-24 NOTE — Progress Notes (Signed)
PROGRESS NOTE        PATIENT DETAILS Name: Patricia Webster Age: 20 y.o. Sex: female Date of Birth: 12/13/1995 Admit Date: 08/23/2016 Admitting Physician Lorretta Harp, MD [id number] [name], MD  Brief Narrative: Patient is a 20 y.o. female with past medical history of anxiety, asthma admitted with fever and headache, underwent lumbar puncture with CSF analysis-consistent with aseptic-likely viral meningitis. Admitted for further evaluation and treatment  Subjective: Appears anxious-irritable-due to repeated beeping of the IV pump. Claims headache is much better. No further neck pain. Afebrile overnight  No chest pain No shortness of breath No nausea or vomiting  Assessment/Plan: Probable viral meningitis: Clinically improved with no further fever, headache and neck stiffness have resolved. CSF studies including cultures still pending-continue empiric acyclovir. Await HIV test as well. ID consulted.  Anxiety: Appears slightly worse-given acute illness/stressful situation. Continue Celexa-add as needed Xanax.  Asthma: Stable-no evidence of flare-lungs clear-continue as needed bronchodilators   DVT Prophylaxis: Prophylactic Lovenox   Code Status: Full code   Family Communication: None at bedside  Disposition Plan: Remain inpatient-home in a few days  Antimicrobial agents: See below  Procedures: LP 10/7>>  CONSULTS:  ID  Time spent: 25 minutes-Greater than 50% of this time was spent in counseling, explanation of diagnosis, planning of further management, and coordination of care.  MEDICATIONS: Anti-infectives    Start     Dose/Rate Route Frequency Ordered Stop   08/24/16 1000  acyclovir (ZOVIRAX) 750 mg in dextrose 5 % 150 mL IVPB     750 mg 165 mL/hr over 60 Minutes Intravenous Every 8 hours 08/24/16 0241     08/24/16 0230  acyclovir (ZOVIRAX) 750 mg in dextrose 5 % 150 mL IVPB     750 mg 165 mL/hr over 60 Minutes Intravenous  Once  08/24/16 0226 08/24/16 0532   08/24/16 0200  vancomycin (VANCOCIN) IVPB 1000 mg/200 mL premix  Status:  Discontinued     1,000 mg 200 mL/hr over 60 Minutes Intravenous Once 08/24/16 0149 08/24/16 0159   08/24/16 0200  cefTRIAXone (ROCEPHIN) 2 g in dextrose 5 % 50 mL IVPB  Status:  Discontinued     2 g 100 mL/hr over 30 Minutes Intravenous Every 24 hours 08/24/16 0149 08/24/16 0159      Scheduled Meds: . acyclovir  750 mg Intravenous Q8H  . lidocaine (PF)      . lidocaine      . sodium chloride flush  3 mL Intravenous Q12H   Continuous Infusions: . sodium chloride     PRN Meds:.acetaminophen, albuterol, ALPRAZolam, ibuprofen, ondansetron   PHYSICAL EXAM: Vital signs: Vitals:   08/24/16 0200 08/24/16 0230 08/24/16 0332 08/24/16 0935  BP: (!) 116/52 118/59 (!) 108/41 124/66  Pulse: 69 70 71 83  Resp: 16 16 20    Temp:   97.7 F (36.5 C) 99.2 F (37.3 C)  TempSrc:   Oral Oral  SpO2: 99% 96% 100% 100%  Weight: 105.2 kg (231 lb 14.8 oz)  107.2 kg (236 lb 4.8 oz)   Height: 5\' 9"  (1.753 m)      Filed Weights   08/24/16 0200 08/24/16 0332  Weight: 105.2 kg (231 lb 14.8 oz) 107.2 kg (236 lb 4.8 oz)   Body mass index is 34.9 kg/m.   General appearance :Awake, alert, not in any distress. Speech Clear. Not toxic Looking Eyes:, pupils equally  reactive to light and accomodation,no scleral icterus.Pink conjunctiva HEENT: Atraumatic and Normocephalic Neck: supple, no JVD. No cervical lymphadenopathy. No thyromegaly Resp:Good air entry bilaterally, no added sounds  CVS: S1 S2 regular, no murmurs.  GI: Bowel sounds present, Non tender and not distended with no gaurding, rigidity or rebound.No organomegaly Extremities: B/L Lower Ext shows no edema, both legs are warm to touch Neurology:  speech clear,Non focal, sensation is grossly intact. Psychiatric: Normal judgment and insight. Alert and oriented x 3. Normal mood. Musculoskeletal:No digital cyanosis Skin:No Rash, warm and  dry Wounds:N/A  I have personally reviewed following labs and imaging studies  LABORATORY DATA: CBC:  Recent Labs Lab 08/23/16 1415 08/24/16 0348  WBC 9.4 6.9  NEUTROABS 6.6  --   HGB 13.5 12.9  HCT 41.9 40.4  MCV 85.0 85.1  PLT 226 205    Basic Metabolic Panel:  Recent Labs Lab 08/23/16 1415 08/24/16 0348  NA 136 138  K 4.0 4.0  CL 107 112*  CO2 22 20*  GLUCOSE 99 149*  BUN 8 9  CREATININE 0.80 0.60  CALCIUM 8.9 8.7*    GFR: Estimated Creatinine Clearance: 147.5 mL/min (by C-G formula based on SCr of 0.6 mg/dL).  Liver Function Tests:  Recent Labs Lab 08/23/16 1415  AST 21  ALT 19  ALKPHOS 52  BILITOT 0.8  PROT 7.0  ALBUMIN 3.6   No results for input(s): LIPASE, AMYLASE in the last 168 hours. No results for input(s): AMMONIA in the last 168 hours.  Coagulation Profile:  Recent Labs Lab 08/24/16 0346  INR 1.11    Cardiac Enzymes: No results for input(s): CKTOTAL, CKMB, CKMBINDEX, TROPONINI in the last 168 hours.  BNP (last 3 results) No results for input(s): PROBNP in the last 8760 hours.  HbA1C: No results for input(s): HGBA1C in the last 72 hours.  CBG: No results for input(s): GLUCAP in the last 168 hours.  Lipid Profile: No results for input(s): CHOL, HDL, LDLCALC, TRIG, CHOLHDL, LDLDIRECT in the last 72 hours.  Thyroid Function Tests: No results for input(s): TSH, T4TOTAL, FREET4, T3FREE, THYROIDAB in the last 72 hours.  Anemia Panel: No results for input(s): VITAMINB12, FOLATE, FERRITIN, TIBC, IRON, RETICCTPCT in the last 72 hours.  Urine analysis:    Component Value Date/Time   COLORURINE YELLOW 08/23/2016 2019   APPEARANCEUR CLEAR 08/23/2016 2019   LABSPEC >1.030 (H) 08/23/2016 2019   PHURINE 6.0 08/23/2016 2019   GLUCOSEU NEGATIVE 08/23/2016 2019   HGBUR NEGATIVE 08/23/2016 2019   BILIRUBINUR NEGATIVE 08/23/2016 2019   KETONESUR 40 (A) 08/23/2016 2019   PROTEINUR NEGATIVE 08/23/2016 2019   UROBILINOGEN 0.2  09/28/2015 1520   NITRITE NEGATIVE 08/23/2016 2019   LEUKOCYTESUR NEGATIVE 08/23/2016 2019    Sepsis Labs: Lactic Acid, Venous    Component Value Date/Time   LATICACIDVEN 0.9 08/24/2016 [id number]    MICROBIOLOGY: Recent Results (from the past 240 hour(s))  CSF culture     Status: None (Preliminary result)   Collection Time: 08/23/16  9:50 PM  Result Value Ref Range Status   Specimen Description CSF  Final   Special Requests Normal  Final   Gram Stain   Final    CYTOSPIN SMEAR WBC PRESENT,BOTH PMN AND MONONUCLEAR NO ORGANISMS SEEN    Culture PENDING  Incomplete   Report Status PENDING  Incomplete  Culture, blood (x 2)     Status: None (Preliminary result)   Collection Time: 08/24/16  3:42 AM  Result Value Ref Range Status   Specimen  Description BLOOD LEFT ARM  Final   Special Requests BOTTLES DRAWN AEROBIC ONLY  Final   Culture PENDING  Incomplete   Report Status PENDING  Incomplete  Culture, blood (x 2)     Status: None (Preliminary result)   Collection Time: 08/24/16  3:46 AM  Result Value Ref Range Status   Specimen Description BLOOD RIGHT HAND  Final   Special Requests BOTTLES DRAWN AEROBIC ONLY  Final   Culture PENDING  Incomplete   Report Status PENDING  Incomplete    RADIOLOGY STUDIES/RESULTS: Ct Head Wo Contrast  Result Date: 08/23/2016 CLINICAL DATA:  Onset of headaches yesterday. EXAM: CT HEAD WITHOUT CONTRAST TECHNIQUE: Contiguous axial images were obtained from the base of the skull through the vertex without intravenous contrast. COMPARISON:  MRI 01/21/2016 FINDINGS: BRAIN: The ventricles and sulci are normal. No intraparenchymal hemorrhage, mass effect nor midline shift. No acute large vascular territory infarcts. No abnormal extra-axial fluid collections. Basal cisterns are patent. VASCULAR: Unremarkable. SKULL/SOFT TISSUES: No skull fracture. No significant soft tissue swelling. ORBITS/SINUSES: The included ocular globes and orbital contents are  normal.The mastoid aircells and included paranasal sinuses are well-aerated. OTHER: None. IMPRESSION: Normal CT head Electronically Signed   By: Tollie Eth M.D.   On: 08/23/2016 21:34   Dg Fluoro Guide Lumbar Puncture  Result Date: 08/24/2016 CLINICAL DATA:  Migraine headaches with neck rigidity. EXAM: DIAGNOSTIC LUMBAR PUNCTURE UNDER FLUOROSCOPIC GUIDANCE FLUOROSCOPY TIME:  Fluoroscopy Time:  54 seconds Radiation Exposure Index (if provided by the fluoroscopic device): Dose area product was 287.45 mcGy meter square Number of Acquired Spot Images: 1 PROCEDURE: Informed consent was obtained from the patient prior to the procedure, including potential complications of headache, allergy, and pain. With the patient prone, the lower back was prepped with Betadine. 1% Lidocaine was used for local anesthesia. Lumbar puncture was performed at the L3-4 level using a 20 gauge needle with return of clear CSF. 8.5 ml of CSF were obtained for laboratory studies. The patient tolerated the procedure well and there were no apparent complications. IMPRESSION: Lumbar puncture performed under fluoroscopy at the L3-4 level. 8.5 mL clear CSF were obtained in 4 vials for laboratory studies ordered by the referring physician. No complications. Electronically Signed   By: Burman Nieves M.D.   On: 08/24/2016 00:55     LOS: 0 days   Jeoffrey Massed, MD  [facility] Pager:[phone]  If 7PM-7AM, please contact night-coverage [email or web address] Password [email or web address] 08/24/2016, 10:25 AM

## 2016-08-24 NOTE — H&P (Addendum)
History and Physical    Patricia Webster ZOX:[id number] DOB: [birth year] DOA: 08/23/2016  Referring MD/NP/PA:   PCP: Danella Maiers, MD   Patient coming from:  The patient is coming from home.  At baseline, pt is independent for most of ADL.  Chief Complaint: Fever, headache, neck stiffness  HPI: Patricia Webster is a 20 y.o. female with medical history significant of asthma, anxiety, who presents with fever, headache and neck stiffness.  Patient states that she started having headache, neck stiffness and fever yesterday. Her headache involves whole head. It is constant, 10 out of 10 severity, nonradiating and aggravated by noise sound. She also has photophobia.  Pt went to [facility] and was sent here. Patient does not have unilateral weakness, numbness in extremities. No chest pain, cough, shortness of breath, nausea, vomiting, diarrhea, abdominal pain, symptoms of UTI.  ED Course: pt was found to have WBC 9.4, lactate 0.99, electrolytes renal function okay, negative urinalysis, softer blood pressure 97/55, tachycardia, tachypnea, temperature 101.3. Lumbar puncture was done. Initial CSF analysis showed WBC 355, 285, red blood cell 40, 385, segment 11%, lymph 72%, glucose 50, protein 59. Pt is admited to tele bed as inpt. ID, Dr. Ninetta Lights was consulted by EDP  Review of Systems:   General: has fevers, chills, no changes in body weight, has fatigue and HA. HEENT: no blurry vision, hearing changes or sore throat Respiratory: no dyspnea, coughing, wheezing CV: no chest pain, no palpitations GI: no nausea, vomiting, abdominal pain, diarrhea, constipation GU: no dysuria, burning on urination, increased urinary frequency, hematuria  Ext: no leg edema Neuro: no unilateral weakness, numbness, or tingling, no vision change or hearing loss. Has neck stiffness Skin: no rash, no skin tear. MSK: No muscle spasm, no deformity, no limitation of range of movement in spin Heme: No easy bruising.  Travel history: No  recent long distant travel.  Allergy: No Known Allergies  Past Medical History:  Diagnosis Date  . Anxiety   . Asthma     History reviewed. No pertinent surgical history.  Social History:  reports that she has never smoked. She has never used smokeless tobacco. She reports that she does not drink alcohol or use drugs.  Family History:  Family History  Problem Relation Age of Onset  . Early death Mother   . Stroke Maternal Grandmother   . Diabetes Maternal Uncle      Prior to Admission medications   Medication Sig Start Date End Date Taking? Authorizing Provider  albuterol (PROVENTIL HFA;VENTOLIN HFA) 108 (90 Base) MCG/ACT inhaler Inhale 1-2 puffs into the lungs every 6 (six) hours as needed for wheezing. 08/05/16  Yes Asiyah Mayra Reel, MD  etonogestrel (NEXPLANON) 68 MG IMPL implant 1 each by Subdermal route once. Reported on 02/05/2016   Yes Historical Provider, MD  Influenza Vac Typ A&B Surf Ant (FLUVIRIN) 0.5 ML SUSY Inject 1 application into the muscle once.   Yes Historical Provider, MD  citalopram (CELEXA) 10 MG tablet Take 1 tablet (10 mg total) by mouth daily. Patient not taking: Reported on 08/23/2016 05/07/16   Berton Bon, MD    Physical Exam: Vitals:   08/24/16 0100 08/24/16 0130 08/24/16 0200 08/24/16 0230  BP: (!) 118/42 121/61 (!) 116/52 118/59  Pulse: 64 83 69 70  Resp:  18 16 16   Temp:      TempSrc:      SpO2: 96% 95% 99% 96%  Weight:   105.2 kg (231 lb 14.8 oz)   Height:  5\' 9"  (1.753 m)    General: Not in acute distress HEENT:       Eyes: PERRL, EOMI, no scleral icterus.       ENT: No discharge from the ears and nose, no pharynx injection, no tonsillar enlargement.        Neck: No JVD, no bruit, no mass felt. Heme: No neck lymph node enlargement. Cardiac: S1/S2, RRR, No murmurs, No gallops or rubs. Respiratory: No rales, wheezing, rhonchi or rubs. GI: Soft, nondistended, nontender, no rebound pain, no organomegaly, BS present. GU: No  hematuria Ext: No pitting leg edema bilaterally. 2+DP/PT pulse bilaterally. Musculoskeletal: No joint deformities, No joint redness or warmth, no limitation of ROM in spin. Skin: No rashes.  Neuro: Alert, oriented X3, cranial nerves II-XII grossly intact, moves all extremities normally. Muscle strength 5/5 in all extremities, sensation to light touch intact.  Negative Babinski's sign. Normal finger to nose test. Neck is stiff. Psych: Patient is not psychotic, no suicidal or hemocidal ideation.  Labs on Admission: I have personally reviewed following labs and imaging studies  CBC:  Recent Labs Lab 08/23/16 1415  WBC 9.4  NEUTROABS 6.6  HGB 13.5  HCT 41.9  MCV 85.0  PLT 226   Basic Metabolic Panel:  Recent Labs Lab 08/23/16 1415  NA 136  K 4.0  CL 107  CO2 22  GLUCOSE 99  BUN 8  CREATININE 0.80  CALCIUM 8.9   GFR: Estimated Creatinine Clearance: 146.1 mL/min (by C-G formula based on SCr of 0.8 mg/dL). Liver Function Tests:  Recent Labs Lab 08/23/16 1415  AST 21  ALT 19  ALKPHOS 52  BILITOT 0.8  PROT 7.0  ALBUMIN 3.6   No results for input(s): LIPASE, AMYLASE in the last 168 hours. No results for input(s): AMMONIA in the last 168 hours. Coagulation Profile: No results for input(s): INR, PROTIME in the last 168 hours. Cardiac Enzymes: No results for input(s): CKTOTAL, CKMB, CKMBINDEX, TROPONINI in the last 168 hours. BNP (last 3 results) No results for input(s): PROBNP in the last 8760 hours. HbA1C: No results for input(s): HGBA1C in the last 72 hours. CBG: No results for input(s): GLUCAP in the last 168 hours. Lipid Profile: No results for input(s): CHOL, HDL, LDLCALC, TRIG, CHOLHDL, LDLDIRECT in the last 72 hours. Thyroid Function Tests: No results for input(s): TSH, T4TOTAL, FREET4, T3FREE, THYROIDAB in the last 72 hours. Anemia Panel: No results for input(s): VITAMINB12, FOLATE, FERRITIN, TIBC, IRON, RETICCTPCT in the last 72 hours. Urine analysis:     Component Value Date/Time   COLORURINE YELLOW 08/23/2016 2019   APPEARANCEUR CLEAR 08/23/2016 2019   LABSPEC >1.030 (H) 08/23/2016 2019   PHURINE 6.0 08/23/2016 2019   GLUCOSEU NEGATIVE 08/23/2016 2019   HGBUR NEGATIVE 08/23/2016 2019   BILIRUBINUR NEGATIVE 08/23/2016 2019   KETONESUR 40 (A) 08/23/2016 2019   PROTEINUR NEGATIVE 08/23/2016 2019   UROBILINOGEN 0.2 09/28/2015 1520   NITRITE NEGATIVE 08/23/2016 2019   LEUKOCYTESUR NEGATIVE 08/23/2016 2019   Sepsis Labs: @LABRCNTIP (procalcitonin:4,lacticidven:4) ) Recent Results (from the past 240 hour(s))  CSF culture     Status: None (Preliminary result)   Collection Time: 08/23/16  9:50 PM  Result Value Ref Range Status   Specimen Description CSF  Final   Special Requests Normal  Final   Gram Stain   Final    CYTOSPIN SMEAR WBC PRESENT,BOTH PMN AND MONONUCLEAR NO ORGANISMS SEEN    Culture PENDING  Incomplete   Report Status PENDING  Incomplete  Radiological Exams on Admission: Ct Head Wo Contrast  Result Date: 08/23/2016 CLINICAL DATA:  Onset of headaches yesterday. EXAM: CT HEAD WITHOUT CONTRAST TECHNIQUE: Contiguous axial images were obtained from the base of the skull through the vertex without intravenous contrast. COMPARISON:  MRI 01/21/2016 FINDINGS: BRAIN: The ventricles and sulci are normal. No intraparenchymal hemorrhage, mass effect nor midline shift. No acute large vascular territory infarcts. No abnormal extra-axial fluid collections. Basal cisterns are patent. VASCULAR: Unremarkable. SKULL/SOFT TISSUES: No skull fracture. No significant soft tissue swelling. ORBITS/SINUSES: The included ocular globes and orbital contents are normal.The mastoid aircells and included paranasal sinuses are well-aerated. OTHER: None. IMPRESSION: Normal CT head Electronically Signed   By: Tollie Ethavid  Kwon M.D.   On: 08/23/2016 21:34   Dg Fluoro Guide Lumbar Puncture  Result Date: 08/24/2016 CLINICAL DATA:  Migraine headaches with neck  rigidity. EXAM: DIAGNOSTIC LUMBAR PUNCTURE UNDER FLUOROSCOPIC GUIDANCE FLUOROSCOPY TIME:  Fluoroscopy Time:  54 seconds Radiation Exposure Index (if provided by the fluoroscopic device): Dose area product was 287.45 mcGy meter square Number of Acquired Spot Images: 1 PROCEDURE: Informed consent was obtained from the patient prior to the procedure, including potential complications of headache, allergy, and pain. With the patient prone, the lower back was prepped with Betadine. 1% Lidocaine was used for local anesthesia. Lumbar puncture was performed at the L3-4 level using a 20 gauge needle with return of clear CSF. 8.5 ml of CSF were obtained for laboratory studies. The patient tolerated the procedure well and there were no apparent complications. IMPRESSION: Lumbar puncture performed under fluoroscopy at the L3-4 level. 8.5 mL clear CSF were obtained in 4 vials for laboratory studies ordered by the referring physician. No complications. Electronically Signed   By: Burman NievesWilliam  Stevens M.D.   On: 08/24/2016 00:55     EKG:  Not done in ED, will get one.   Assessment/Plan Principal Problem:   Meningitis Active Problems:   Anxiety state   Sepsis (HCC)   Asthma   Meningitis and sepsis: Initial CSF analysis showed WBC 355, 285, red blood cell 40, 385, segment 11%, lymph 72%, glucose 50, protein 59. ID, Dr. Ninetta LightsHatcher was consulted by EDP. Per Dr. Ninetta LightsHatcher, likely viral meningitis, he recommended treating with antivirals until viral CSF tests return. Patient meets criteria for sepsis with fever, tachycardia, tachypnea. Lactate is normal. Hemodynamically stable.  -will admit to tele bed as inpt (pt has meningitis, likely due to viral, but I cannot completely rule out other type of meningitis until CSF tests return back. Pt has sepsis and is at risk of deteriorating. Patient requires inpatient status due to high intensity of service, high risk for further deterioration and high frequency of surveillance  required). -start IV acyclovir -When necessary Tylenol and ibuprofen for headache -will get Procalcitonin and trend lactic acid levels per sepsis protocol. -IVF: 3L of NS bolus in ED, followed by 75 cc/h  -f/u Bx, Ux and CSF analysis -Frequent neuro checks -preg test  Anxiety: -prn Xanax  Asthma: stable -prn Albuterol inhaler.  DVT ppx: SCD Code Status: Full code Family Communication: Yes, patient's boyfriend at bed side Disposition Plan:  Anticipate discharge back to previous home environment Consults called:  EDP dicussed with ID, Dr. Ninetta LightsHatcher Admission status: Inpatient/tele  Date of Service 08/24/2016    Lorretta HarpNIU, Charlesetta Milliron [facility] Pager [phone]  If 7PM-7AM, please contact night-coverage [email or web address] Password [email or web address] 08/24/2016, 2:43 AM

## 2016-08-24 NOTE — Procedures (Signed)
Lumbar Puncture Procedure Note  Pre-operative Diagnosis: Headache and neck stiffness  Post-operative Diagnosis:same  Indications: Diagnostic  Procedure Details   Consent: Informed consent was obtained. Risks of the procedure were discussed including: infection, bleeding, pain and headache.  The patient was positioned under sterile conditions. Betadine solution and sterile drapes were utilized. A spinal needle was inserted at the L3-4 interspace.  Spinal fluid was obtained and sent to the laboratory.  Findings 8.5 mL of clear spinal fluid was obtained.   Complications:  None; patient tolerated the procedure well.        Condition: stable  Plan Bed rest for 4 hours. Tylenol 650 mg for pain. Call ED if you develop a severe headache, nausea, vomiting, or fever greater than 100.5 F.

## 2016-08-24 NOTE — Consult Note (Signed)
[facility]  Date of Admission:  08/23/2016  Date of Consult:  08/24/2016  Reason for Consult: Meningitis Referring Physician: Sloan Leiter  Impression/Recommendation Meningitis Continue IV acyclovir Check herpes PCR on spinal fluid as well as enterovirus PCR Check HIV RNA, syphilis gonorrhea and chlamydia Check ANA  Diabetes mellitus? Her Glc was 149.  Will check HgB A1C  Comment- Her course to date as well as her laboratories suggest that she has aseptic (lymphocytic) meningitis. He is much improved today. 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 Hopefully she will continue to do well and we can convert to oral therapy and have her home soon.  Thank you so much for this interesting consult,   [name] (pager) [phone] [email or web address]  Patricia Webster is an 20 y.o. female.  HPI: 20 year old female with a past medical history of asthma and anxiety, comes to the emergency room on 10-6 with 24 hours of fever and neck stiffness and headache. He also noted dizziness and photophobia. She denies sick contacts, but bites, or any recent genital lesions or new sex partners (has been with current partner for the last 1-2 years).  She had Chlamydia treated in 2016. She states she received all her normal childhood vaccinations.  In the emergency room she was found to have a temperature of 101.3. White blood cell count was normal. Her CT of the head was unremarkable. Her CSF on tube 4 showed 40 right blood blood cells 285 white blood cells (11% neutrophils). Her CSF glucose was 50 and her protein was 59. She was admitted to the hospital and started on IV acyclovir.  Past Medical History:  Diagnosis Date  . Anxiety   . Asthma     History reviewed. No pertinent surgical history.   No Known Allergies  Medications:  Scheduled: . acyclovir  750 mg Intravenous Q8H  . [START ON 08/25/2016] enoxaparin (LOVENOX) injection  40 mg Subcutaneous  Q24H  . sodium chloride flush  3 mL Intravenous Q12H    Abtx:  Anti-infectives    Start     Dose/Rate Route Frequency Ordered Stop   08/24/16 1000  acyclovir (ZOVIRAX) 750 mg in dextrose 5 % 150 mL IVPB     750 mg 165 mL/hr over 60 Minutes Intravenous Every 8 hours 08/24/16 0241     08/24/16 0230  acyclovir (ZOVIRAX) 750 mg in dextrose 5 % 150 mL IVPB     750 mg 165 mL/hr over 60 Minutes Intravenous  Once 08/24/16 0226 08/24/16 0532   08/24/16 0200  vancomycin (VANCOCIN) IVPB 1000 mg/200 mL premix  Status:  Discontinued     1,000 mg 200 mL/hr over 60 Minutes Intravenous Once 08/24/16 0149 08/24/16 0159   08/24/16 0200  cefTRIAXone (ROCEPHIN) 2 g in dextrose 5 % 50 mL IVPB  Status:  Discontinued     2 g 100 mL/hr over 30 Minutes Intravenous Every 24 hours 08/24/16 0149 08/24/16 0159      Total days of antibiotics: 0 acyclovir          Social History:  reports that she has never smoked. She has never used smokeless tobacco. She reports that she does not drink alcohol or use drugs.  Family History  Problem Relation Age of Onset  . Early death Mother   . Stroke Maternal Grandmother   . Diabetes Maternal Uncle     General ROS: No diarrhea. No rash. No dysphagia. No oral sores. No shortness of breath. Normal urination.  Blood pressure 124/66, pulse 83, temperature 99.2 F (37.3 C), temperature source Oral, resp. rate 20, height '5\' 9"'$  (1.753 m), weight 107.2 kg (236 lb 4.8 oz), SpO2 100 %. General appearance: alert, cooperative and no distress Eyes: negative findings: conjunctivae and sclerae normal, pupils equal, round, reactive to light and accomodation and no photophbia Throat: lips, mucosa, and tongue normal; teeth and gums normal Neck: no adenopathy, supple, symmetrical, trachea midline and FROM, no resistance to passive motion. Lungs: clear to auscultation bilaterally Heart: regular rate and rhythm Abdomen: normal findings: bowel sounds normal and soft,  non-tender Extremities: edema none Skin: Skin color, texture, turgor normal. No rashes or lesions   Results for orders placed or performed during the hospital encounter of 08/23/16 (from the past 48 hour(s))  CSF cell count with differential collection tube #: 1     Status: Abnormal   Collection Time: 08/23/16 12:33 AM  Result Value Ref Range   Tube # 1    Color, CSF COLORLESS COLORLESS   Appearance, CSF CLEAR CLEAR   Supernatant NOT INDICATED    RBC Count, CSF 385 (H) 0 /cu mm   WBC, CSF 355 (HH) 0 - 5 /cu mm    Comment: CRITICAL RESULT CALLED TO, READ BACK BY AND VERIFIED WITH: T. PHILIPS RN [phone] GREEN R    Segmented Neutrophils-CSF 10 (H) 0 - 6 %   Lymphs, CSF 74 40 - 80 %   Monocyte-Macrophage-Spinal Fluid 16 15 - 45 %  CSF cell count with differential collection tube #: 4     Status: Abnormal   Collection Time: 08/23/16 12:35 AM  Result Value Ref Range   Tube # 4    Color, CSF COLORLESS COLORLESS   Appearance, CSF CLEAR CLEAR   Supernatant NOT INDICATED    RBC Count, CSF 40 (H) 0 /cu mm   WBC, CSF 285 (HH) 0 - 5 /cu mm    Comment: CRITICAL RESULT CALLED TO, READ BACK BY AND VERIFIED WITH: T. PHILIPS RN [phone] GREEN R    Segmented Neutrophils-CSF 11 (H) 0 - 6 %   Lymphs, CSF 72 40 - 80 %   Monocyte-Macrophage-Spinal Fluid 16 15 - 45 %   Eosinophils, CSF 1 0 - 1 %  Glucose, CSF     Status: None   Collection Time: 08/23/16 12:39 AM  Result Value Ref Range   Glucose, CSF 50 40 - 70 mg/dL  Protein, CSF     Status: Abnormal   Collection Time: 08/23/16 12:39 AM  Result Value Ref Range   Total  Protein, CSF 59 (H) 15 - 45 mg/dL  Comprehensive metabolic panel     Status: None   Collection Time: 08/23/16  2:15 PM  Result Value Ref Range   Sodium 136 135 - 145 mmol/L   Potassium 4.0 3.5 - 5.1 mmol/L   Chloride 107 101 - 111 mmol/L   CO2 22 22 - 32 mmol/L   Glucose, Bld 99 65 - 99 mg/dL   BUN 8 6 - 20 mg/dL   Creatinine, Ser 0.80 0.44 - 1.00 mg/dL   Calcium  8.9 8.9 - 10.3 mg/dL   Total Protein 7.0 6.5 - 8.1 g/dL   Albumin 3.6 3.5 - 5.0 g/dL   AST 21 15 - 41 U/L   ALT 19 14 - 54 U/L   Alkaline Phosphatase 52 38 - 126 U/L   Total Bilirubin 0.8 0.3 - 1.2 mg/dL   GFR calc non Af Amer >60 >60  mL/min   GFR calc Af Amer >60 >60 mL/min    Comment: (NOTE) The eGFR has been calculated using the CKD EPI equation. This calculation has not been validated in all clinical situations. eGFR's persistently <60 mL/min signify possible Chronic Kidney Disease.    Anion gap 7 5 - 15  CBC with Differential     Status: None   Collection Time: 08/23/16  2:15 PM  Result Value Ref Range   WBC 9.4 4.0 - 10.5 K/uL   RBC 4.93 3.87 - 5.11 MIL/uL   Hemoglobin 13.5 12.0 - 15.0 g/dL   HCT 41.9 36.0 - 46.0 %   MCV 85.0 78.0 - 100.0 fL   MCH 27.4 26.0 - 34.0 pg   MCHC 32.2 30.0 - 36.0 g/dL   RDW 13.2 11.5 - 15.5 %   Platelets 226 150 - 400 K/uL   Neutrophils Relative % 70 %   Neutro Abs 6.6 1.7 - 7.7 K/uL   Lymphocytes Relative 23 %   Lymphs Abs 2.2 0.7 - 4.0 K/uL   Monocytes Relative 7 %   Monocytes Absolute 0.7 0.1 - 1.0 K/uL   Eosinophils Relative 0 %   Eosinophils Absolute 0.0 0.0 - 0.7 K/uL   Basophils Relative 0 %   Basophils Absolute 0.0 0.0 - 0.1 K/uL  I-Stat beta hCG blood, ED     Status: None   Collection Time: 08/23/16  2:26 PM  Result Value Ref Range   I-stat hCG, quantitative <5.0 <5 mIU/mL   Comment 3            Comment:   GEST. AGE      CONC.  (mIU/mL)   <=1 WEEK        5 - 50     2 WEEKS       50 - 500     3 WEEKS       100 - 10,000     4 WEEKS     1,000 - 30,000        FEMALE AND NON-PREGNANT FEMALE:     LESS THAN 5 mIU/mL   I-Stat CG4 Lactic Acid, ED     Status: None   Collection Time: 08/23/16  2:29 PM  Result Value Ref Range   Lactic Acid, Venous 0.91 0.5 - 1.9 mmol/L  Urinalysis, Routine w reflex microscopic     Status: Abnormal   Collection Time: 08/23/16  8:19 PM  Result Value Ref Range   Color, Urine YELLOW YELLOW    APPearance CLEAR CLEAR   Specific Gravity, Urine >1.030 (H) 1.005 - 1.030   pH 6.0 5.0 - 8.0   Glucose, UA NEGATIVE NEGATIVE mg/dL   Hgb urine dipstick NEGATIVE NEGATIVE   Bilirubin Urine NEGATIVE NEGATIVE   Ketones, ur 40 (A) NEGATIVE mg/dL   Protein, ur NEGATIVE NEGATIVE mg/dL   Nitrite NEGATIVE NEGATIVE   Leukocytes, UA NEGATIVE NEGATIVE    Comment: MICROSCOPIC NOT DONE ON URINES WITH NEGATIVE PROTEIN, BLOOD, LEUKOCYTES, NITRITE, OR GLUCOSE <1000 mg/dL.  CSF culture     Status: None (Preliminary result)   Collection Time: 08/23/16  9:50 PM  Result Value Ref Range   Specimen Description CSF    Special Requests Normal    Gram Stain      CYTOSPIN SMEAR WBC PRESENT,BOTH PMN AND MONONUCLEAR NO ORGANISMS SEEN    Culture PENDING    Report Status PENDING   Culture, blood (x 2)     Status: None (Preliminary result)   Collection Time:  08/24/16  3:42 AM  Result Value Ref Range   Specimen Description BLOOD LEFT ARM    Special Requests BOTTLES DRAWN AEROBIC ONLY 1ML    Culture PENDING    Report Status PENDING   Culture, blood (x 2)     Status: None (Preliminary result)   Collection Time: 08/24/16  3:46 AM  Result Value Ref Range   Specimen Description BLOOD RIGHT HAND    Special Requests BOTTLES DRAWN AEROBIC ONLY 1ML    Culture PENDING    Report Status PENDING   Lactic acid, plasma     Status: None   Collection Time: 08/24/16  3:46 AM  Result Value Ref Range   Lactic Acid, Venous 0.8 0.5 - 1.9 mmol/L  Protime-INR     Status: None   Collection Time: 08/24/16  3:46 AM  Result Value Ref Range   Prothrombin Time 14.3 11.4 - 15.2 seconds   INR 1.11   APTT     Status: None   Collection Time: 08/24/16  3:46 AM  Result Value Ref Range   aPTT 28 24 - 36 seconds  Basic metabolic panel     Status: Abnormal   Collection Time: 08/24/16  3:48 AM  Result Value Ref Range   Sodium 138 135 - 145 mmol/L   Potassium 4.0 3.5 - 5.1 mmol/L   Chloride 112 (H) 101 - 111 mmol/L   CO2 20 (L) 22 -  32 mmol/L   Glucose, Bld 149 (H) 65 - 99 mg/dL   BUN 9 6 - 20 mg/dL   Creatinine, Ser 0.60 0.44 - 1.00 mg/dL   Calcium 8.7 (L) 8.9 - 10.3 mg/dL   GFR calc non Af Amer >60 >60 mL/min   GFR calc Af Amer >60 >60 mL/min    Comment: (NOTE) The eGFR has been calculated using the CKD EPI equation. This calculation has not been validated in all clinical situations. eGFR's persistently <60 mL/min signify possible Chronic Kidney Disease.    Anion gap 6 5 - 15  CBC     Status: None   Collection Time: 08/24/16  3:48 AM  Result Value Ref Range   WBC 6.9 4.0 - 10.5 K/uL   RBC 4.75 3.87 - 5.11 MIL/uL   Hemoglobin 12.9 12.0 - 15.0 g/dL   HCT 40.4 36.0 - 46.0 %   MCV 85.1 78.0 - 100.0 fL   MCH 27.2 26.0 - 34.0 pg   MCHC 31.9 30.0 - 36.0 g/dL   RDW 13.1 11.5 - 15.5 %   Platelets 205 150 - 400 K/uL  hCG, quantitative, pregnancy     Status: None   Collection Time: 08/24/16  3:48 AM  Result Value Ref Range   hCG, Beta Chain, Quant, S 1 <5 mIU/mL    Comment:          GEST. AGE      CONC.  (mIU/mL)   <=1 WEEK        5 - 50     2 WEEKS       50 - 500     3 WEEKS       100 - 10,000     4 WEEKS     1,000 - 30,000     5 WEEKS     3,500 - 115,000   6-8 WEEKS     12,000 - 270,000    12 WEEKS     15,000 - 220,000        FEMALE AND NON-PREGNANT FEMALE:  LESS THAN 5 mIU/mL   Procalcitonin     Status: None   Collection Time: 08/24/16  3:48 AM  Result Value Ref Range   Procalcitonin <0.10 ng/mL    Comment:        Interpretation: PCT (Procalcitonin) <= 0.5 ng/mL: Systemic infection (sepsis) is not likely. Local bacterial infection is possible. (NOTE)         ICU PCT Algorithm               Non ICU PCT Algorithm    ----------------------------     ------------------------------         PCT < 0.25 ng/mL                 PCT < 0.1 ng/mL     Stopping of antibiotics            Stopping of antibiotics       strongly encouraged.               strongly encouraged.    ----------------------------      ------------------------------       PCT level decrease by               PCT < 0.25 ng/mL       >= 80% from peak PCT       OR PCT 0.25 - 0.5 ng/mL          Stopping of antibiotics                                             encouraged.     Stopping of antibiotics           encouraged.    ----------------------------     ------------------------------       PCT level decrease by              PCT >= 0.25 ng/mL       < 80% from peak PCT        AND PCT >= 0.5 ng/mL            Continuin g antibiotics                                              encouraged.       Continuing antibiotics            encouraged.    ----------------------------     ------------------------------     PCT level increase compared          PCT > 0.5 ng/mL         with peak PCT AND          PCT >= 0.5 ng/mL             Escalation of antibiotics                                          strongly encouraged.      Escalation of antibiotics        strongly encouraged.   Lactic acid, plasma     Status: None   Collection Time: 08/24/16  6:42 AM  Result Value Ref Range   Lactic Acid, Venous 0.9 0.5 - 1.9 mmol/L      Component Value Date/Time   SDES BLOOD RIGHT HAND 08/24/2016 0346   SPECREQUEST BOTTLES DRAWN AEROBIC ONLY 1ML 08/24/2016 0346   CULT PENDING 08/24/2016 0346   REPTSTATUS PENDING 08/24/2016 0346   Ct Head Wo Contrast  Result Date: 08/23/2016 CLINICAL DATA:  Onset of headaches yesterday. EXAM: CT HEAD WITHOUT CONTRAST TECHNIQUE: Contiguous axial images were obtained from the base of the skull through the vertex without intravenous contrast. COMPARISON:  MRI 01/21/2016 FINDINGS: BRAIN: The ventricles and sulci are normal. No intraparenchymal hemorrhage, mass effect nor midline shift. No acute large vascular territory infarcts. No abnormal extra-axial fluid collections. Basal cisterns are patent. VASCULAR: Unremarkable. SKULL/SOFT TISSUES: No skull fracture. No significant soft tissue swelling. ORBITS/SINUSES:  The included ocular globes and orbital contents are normal.The mastoid aircells and included paranasal sinuses are well-aerated. OTHER: None. IMPRESSION: Normal CT head Electronically Signed   By: Ashley Royalty M.D.   On: 08/23/2016 21:34   Dg Fluoro Guide Lumbar Puncture  Result Date: 08/24/2016 CLINICAL DATA:  Migraine headaches with neck rigidity. EXAM: DIAGNOSTIC LUMBAR PUNCTURE UNDER FLUOROSCOPIC GUIDANCE FLUOROSCOPY TIME:  Fluoroscopy Time:  54 seconds Radiation Exposure Index (if provided by the fluoroscopic device): Dose area product was 287.45 mcGy meter square Number of Acquired Spot Images: 1 PROCEDURE: Informed consent was obtained from the patient prior to the procedure, including potential complications of headache, allergy, and pain. With the patient prone, the lower back was prepped with Betadine. 1% Lidocaine was used for local anesthesia. Lumbar puncture was performed at the L3-4 level using a 20 gauge needle with return of clear CSF. 8.5 ml of CSF were obtained for laboratory studies. The patient tolerated the procedure well and there were no apparent complications. IMPRESSION: Lumbar puncture performed under fluoroscopy at the L3-4 level. 8.5 mL clear CSF were obtained in 4 vials for laboratory studies ordered by the referring physician. No complications. Electronically Signed   By: Lucienne Capers M.D.   On: 08/24/2016 00:55   Recent Results (from the past 240 hour(s))  CSF culture     Status: None (Preliminary result)   Collection Time: 08/23/16  9:50 PM  Result Value Ref Range Status   Specimen Description CSF  Final   Special Requests Normal  Final   Gram Stain   Final    CYTOSPIN SMEAR WBC PRESENT,BOTH PMN AND MONONUCLEAR NO ORGANISMS SEEN    Culture PENDING  Incomplete   Report Status PENDING  Incomplete  Culture, blood (x 2)     Status: None (Preliminary result)   Collection Time: 08/24/16  3:42 AM  Result Value Ref Range Status   Specimen Description BLOOD LEFT ARM   Final   Special Requests BOTTLES DRAWN AEROBIC ONLY 1ML  Final   Culture PENDING  Incomplete   Report Status PENDING  Incomplete  Culture, blood (x 2)     Status: None (Preliminary result)   Collection Time: 08/24/16  3:46 AM  Result Value Ref Range Status   Specimen Description BLOOD RIGHT HAND  Final   Special Requests BOTTLES DRAWN AEROBIC ONLY 1ML  Final   Culture PENDING  Incomplete   Report Status PENDING  Incomplete      08/24/2016, 1:25 PM     LOS: 0 days    Records and images were personally reviewed where available.

## 2016-08-24 NOTE — Progress Notes (Signed)
Pharmacy Antimicrobial Note  Patricia Webster is a 20 y.o. female admitted on 08/23/2016 with probable viral meningitis.  Pharmacy has been consulted for acyclovir dosing.  Plan: Acyclovir 750mg  IV Q8H.  Height: 5\' 9"  (175.3 cm) Weight: 231 lb 14.8 oz (105.2 kg) IBW/kg (Calculated) : 66.2  Adjusted wt ~75kg  Temp (24hrs), Avg:100.1 F (37.8 C), Min:99.2 F (37.3 C), Max:101.3 F (38.5 C)   Recent Labs Lab 08/23/16 1415 08/23/16 1429  WBC 9.4  --   CREATININE 0.80  --   LATICACIDVEN  --  0.91    Estimated Creatinine Clearance: 146.1 mL/min (by C-G formula based on SCr of 0.8 mg/dL).    No Known Allergies   Thank you for allowing pharmacy to be a part of this patient's care.  Patricia GamblesVeronda Catrice Webster, PharmD, BCPS  08/24/2016 2:38 AM

## 2016-08-25 DIAGNOSIS — G4700 Insomnia, unspecified: Secondary | ICD-10-CM

## 2016-08-25 LAB — URINE CULTURE

## 2016-08-25 LAB — HIV-1 RNA QUANT-NO REFLEX-BLD: LOG10 HIV-1 RNA: UNDETERMINED {Log_copies}/mL

## 2016-08-25 LAB — HEMOGLOBIN A1C
HEMOGLOBIN A1C: 5 % (ref 4.8–5.6)
Mean Plasma Glucose: 97 mg/dL

## 2016-08-25 LAB — RPR: RPR: NONREACTIVE

## 2016-08-25 LAB — HERPES SIMPLEX VIRUS(HSV) DNA BY PCR
HSV 1 DNA: NEGATIVE
HSV 2 DNA: POSITIVE — AB

## 2016-08-25 MED ORDER — VALACYCLOVIR HCL 1 G PO TABS: 1000.0000 mg | ORAL_TABLET | Freq: Three times a day (TID) | ORAL | 0 refills | Status: DC

## 2016-08-25 NOTE — Discharge Summary (Addendum)
PATIENT DETAILS Name: Patricia Webster Age: 20 y.o. Sex: female Date of Birth: Jan 13, 1996 MRN: [id number]. Admitting Physician: Patricia Harp, MD Patricia Patricia Giovanni, MD  Admit Date: 08/23/2016 Discharge date: 08/25/2016  Recommendations for Outpatient Follow-up:  1. Follow up with PCP in 1-2 weeks 2. HIV viral load, ANA, GC probe, and VDRL CSF, HSV PCR CSF, Enterovirus PCR all pending-will need to be followed by PCP 3. Follow CSF cultures until negative 4. Please obtain BMP/CBC in one week  Admitted From:  Home  Disposition: Home  Home Health: No  Equipment/Devices: None  Discharge Condition: Stable  CODE STATUS: FULL CODE  Diet recommendation:   Regular  Brief Summary: See H&P, Labs, Consult and Test reports for all details in brief, Patient is a 20 y.o. female with past medical history of anxiety, asthma admitted with fever and headache, underwent lumbar puncture with CSF analysis-consistent with aseptic-likely viral meningitis. Admitted for further evaluation and treatment  Brief Hospital Course: Probable viral meningitis: Much improved, no further fever or headache or neck stiffness. CSF cultures negative. Initially started on intravenous acyclovir, spoke with Patricia Webster today-ID-recommendations are to transition to Valtrex for 1 more week. Okay to discharge from his point of view as well. Discussed with patient-have informed her that some of her labs are still pending and will need to be followed by PCP sometime next week. She claims understanding.    Addendum 10/9 4 pm HSV 2 PCR CSF +-spoke with Patricia Webster-can continue with oral Valtrex  Anxiety: Better controlled today, continue Celexa on discharge.    Asthma: Stable-no evidence of flare-lungs clear-continue as needed bronchodilators   Procedures/Studies: LP 10/7>>  Discharge Diagnoses:  Principal Problem:   Meningitis Active Problems:   Anxiety state   Sepsis (HCC)   Asthma   Discharge  Instructions:  Activity:  As tolerated with Full fall precautions use walker/cane & assistance as needed   Discharge Instructions    Call MD for:  persistant nausea and vomiting    Complete by:  As directed    Call MD for:  severe uncontrolled pain    Complete by:  As directed    Diet general    Complete by:  As directed    Discharge instructions    Complete by:  As directed    Follow with Primary MD  Patricia Patricia Giovanni, MD    Please get a complete blood count and chemistry panel checked by your Primary MD at your next visit, and again as instructed by your Primary MD.  Get Medicines reviewed and adjusted: Please take all your medications with you for your next visit with your Primary MD  Laboratory/radiological data: Please request your Primary MD to go over all hospital tests and procedure/radiological results at the follow up, please ask your Primary MD to get all Hospital records sent to his/her office.  In some cases, they will be blood work, cultures and biopsy results pending at the time of your discharge. Please request that your primary care M.D. follows up on these results.  Also Note the following: If you experience worsening of your admission symptoms, develop shortness of breath, life threatening emergency, suicidal or homicidal thoughts you must seek medical attention immediately by calling 911 or calling your MD immediately  if symptoms less severe.  You must read complete instructions/literature along with all the possible adverse reactions/side effects for all the Medicines you take and that have been prescribed to you. Take any new Medicines after you have completely  understood and accpet all the possible adverse reactions/side effects.   Do not drive when taking Pain medications or sleeping medications (Benzodaizepines)  Do not take more than prescribed Pain, Sleep and Anxiety Medications. It is not advisable to combine anxiety,sleep and pain medications without  talking with your primary care practitioner  Special Instructions: If you have smoked or chewed Tobacco  in the last 2 yrs please stop smoking, stop any regular Alcohol  and or any Recreational drug use.  Wear Seat belts while driving.  Please note: You were cared for by a hospitalist during your hospital stay. Once you are discharged, your primary care physician will handle any further medical issues. Please note that NO REFILLS for any discharge medications will be authorized once you are discharged, as it is imperative that you return to your primary care physician (or establish a relationship with a primary care physician if you do not have one) for your post hospital discharge needs so that they can reassess your need for medications and monitor your lab values.   Increase activity slowly    Complete by:  As directed        Medication List    TAKE these medications   albuterol 108 (90 Base) MCG/ACT inhaler Commonly known as:  PROVENTIL HFA;VENTOLIN HFA Inhale 1-2 puffs into the lungs every 6 (six) hours as needed for wheezing.   citalopram 10 MG tablet Commonly known as:  CELEXA Take 1 tablet (10 mg total) by mouth daily.   FLUVIRIN 0.5 ML Susy Generic drug:  Influenza Vac Typ A&B Surf Ant Inject 1 application into the muscle once.   NEXPLANON 68 MG Impl implant Generic drug:  etonogestrel 1 each by Subdermal route once. Reported on 02/05/2016   valACYclovir 1000 MG tablet Commonly known as:  VALTREX Take 1 tablet (1,000 mg total) by mouth 3 (three) times daily.      Follow-up Information    Patricia Patricia Giovanni, MD. Schedule an appointment as soon as possible for a visit in 1 week(s).   Specialty:  Family Medicine Contact information: [address] [place] [address] [phone]          No Known Allergies  Consultations:   ID   Other Procedures/Studies: Ct Head Wo Contrast  Result Date: 08/23/2016 CLINICAL DATA:  Onset of headaches yesterday. EXAM:  CT HEAD WITHOUT CONTRAST TECHNIQUE: Contiguous axial images were obtained from the base of the skull through the vertex without intravenous contrast. COMPARISON:  MRI 01/21/2016 FINDINGS: BRAIN: The ventricles and sulci are normal. No intraparenchymal hemorrhage, mass effect nor midline shift. No acute large vascular territory infarcts. No abnormal extra-axial fluid collections. Basal cisterns are patent. VASCULAR: Unremarkable. SKULL/SOFT TISSUES: No skull fracture. No significant soft tissue swelling. ORBITS/SINUSES: The included ocular globes and orbital contents are normal.The mastoid aircells and included paranasal sinuses are well-aerated. OTHER: None. IMPRESSION: Normal CT head Electronically Signed   By: Tollie Eth M.D.   On: 08/23/2016 21:34   Dg Fluoro Guide Lumbar Puncture  Result Date: 08/24/2016 CLINICAL DATA:  Migraine headaches with neck rigidity. EXAM: DIAGNOSTIC LUMBAR PUNCTURE UNDER FLUOROSCOPIC GUIDANCE FLUOROSCOPY TIME:  Fluoroscopy Time:  54 seconds Radiation Exposure Index (if provided by the fluoroscopic device): Dose area product was 287.45 mcGy meter square Number of Acquired Spot Images: 1 PROCEDURE: Informed consent was obtained from the patient prior to the procedure, including potential complications of headache, allergy, and pain. With the patient prone, the lower back was prepped with Betadine. 1% Lidocaine was  used for local anesthesia. Lumbar puncture was performed at the L3-4 level using a 20 gauge needle with return of clear CSF. 8.5 ml of CSF were obtained for laboratory studies. The patient tolerated the procedure well and there were no apparent complications. IMPRESSION: Lumbar puncture performed under fluoroscopy at the L3-4 level. 8.5 mL clear CSF were obtained in 4 vials for laboratory studies ordered by the referring physician. No complications. Electronically Signed   By: Burman Nieves M.D.   On: 08/24/2016 00:55     TODAY-DAY OF DISCHARGE:  Subjective:    Patricia Webster today has no headache,no chest abdominal pain,no new weakness tingling or numbness, feels much better wants to go home today.   Objective:   Blood pressure (!) 104/39, pulse 71, temperature 98.6 F (37 C), temperature source Oral, resp. rate 17, height 5\' 9"  (1.753 m), weight 107.2 kg (236 lb 4.8 oz), SpO2 100 %.  Intake/Output Summary (Last 24 hours) at 08/25/16 1424 Last data filed at 08/25/16 0930  Gross per 24 hour  Intake          1503.75 ml  Output             1450 ml  Net            53.75 ml   Filed Weights   08/24/16 0200 08/24/16 0332  Weight: 105.2 kg (231 lb 14.8 oz) 107.2 kg (236 lb 4.8 oz)    Exam: Awake Alert, Oriented *3, No new F.N deficits, Normal affect [place].AT,PERRAL Supple Neck,No JVD, No cervical lymphadenopathy appriciated.  Symmetrical Chest wall movement, Good air movement bilaterally, CTAB RRR,No Gallops,Rubs or new Murmurs, No Parasternal Heave +ve B.Sounds, Abd Soft, Non tender, No organomegaly appriciated, No rebound -guarding or rigidity. No Cyanosis, Clubbing or edema, No new Rash or bruise   PERTINENT RADIOLOGIC STUDIES: Ct Head Wo Contrast  Result Date: 08/23/2016 CLINICAL DATA:  Onset of headaches yesterday. EXAM: CT HEAD WITHOUT CONTRAST TECHNIQUE: Contiguous axial images were obtained from the base of the skull through the vertex without intravenous contrast. COMPARISON:  MRI 01/21/2016 FINDINGS: BRAIN: The ventricles and sulci are normal. No intraparenchymal hemorrhage, mass effect nor midline shift. No acute large vascular territory infarcts. No abnormal extra-axial fluid collections. Basal cisterns are patent. VASCULAR: Unremarkable. SKULL/SOFT TISSUES: No skull fracture. No significant soft tissue swelling. ORBITS/SINUSES: The included ocular globes and orbital contents are normal.The mastoid aircells and included paranasal sinuses are well-aerated. OTHER: None. IMPRESSION: Normal CT head Electronically Signed   By: Tollie Eth  M.D.   On: 08/23/2016 21:34   Dg Fluoro Guide Lumbar Puncture  Result Date: 08/24/2016 CLINICAL DATA:  Migraine headaches with neck rigidity. EXAM: DIAGNOSTIC LUMBAR PUNCTURE UNDER FLUOROSCOPIC GUIDANCE FLUOROSCOPY TIME:  Fluoroscopy Time:  54 seconds Radiation Exposure Index (if provided by the fluoroscopic device): Dose area product was 287.45 mcGy meter square Number of Acquired Spot Images: 1 PROCEDURE: Informed consent was obtained from the patient prior to the procedure, including potential complications of headache, allergy, and pain. With the patient prone, the lower back was prepped with Betadine. 1% Lidocaine was used for local anesthesia. Lumbar puncture was performed at the L3-4 level using a 20 gauge needle with return of clear CSF. 8.5 ml of CSF were obtained for laboratory studies. The patient tolerated the procedure well and there were no apparent complications. IMPRESSION: Lumbar puncture performed under fluoroscopy at the L3-4 level. 8.5 mL clear CSF were obtained in 4 vials for laboratory studies ordered by the referring physician. No  complications. Electronically Signed   By: Burman Nieves M.D.   On: 08/24/2016 00:55     PERTINENT LAB RESULTS: CBC:  Recent Labs  08/23/16 1415 08/24/16 0348  WBC 9.4 6.9  HGB 13.5 12.9  HCT 41.9 40.4  PLT 226 205   CMET CMP     Component Value Date/Time   NA 138 08/24/2016 0348   K 4.0 08/24/2016 0348   CL 112 (H) 08/24/2016 0348   CO2 20 (L) 08/24/2016 0348   GLUCOSE 149 (H) 08/24/2016 0348   BUN 9 08/24/2016 0348   CREATININE 0.60 08/24/2016 0348   CALCIUM 8.7 (L) 08/24/2016 0348   PROT 7.0 08/23/2016 1415   ALBUMIN 3.6 08/23/2016 1415   AST 21 08/23/2016 1415   ALT 19 08/23/2016 1415   ALKPHOS 52 08/23/2016 1415   BILITOT 0.8 08/23/2016 1415   GFRNONAA >60 08/24/2016 0348   GFRAA >60 08/24/2016 0348    GFR Estimated Creatinine Clearance: 147.5 mL/min (by C-G formula based on SCr of 0.6 mg/dL). No results for  input(s): LIPASE, AMYLASE in the last 72 hours. No results for input(s): CKTOTAL, CKMB, CKMBINDEX, TROPONINI in the last 72 hours. Invalid input(s): POCBNP No results for input(s): DDIMER in the last 72 hours.  Recent Labs  08/24/16 1407  HGBA1C 5.0   No results for input(s): CHOL, HDL, LDLCALC, TRIG, CHOLHDL, LDLDIRECT in the last 72 hours. No results for input(s): TSH, T4TOTAL, T3FREE, THYROIDAB in the last 72 hours.  Invalid input(s): FREET3 No results for input(s): VITAMINB12, FOLATE, FERRITIN, TIBC, IRON, RETICCTPCT in the last 72 hours. Coags:  Recent Labs  08/24/16 0346  INR 1.11   Microbiology: Recent Results (from the past 240 hour(s))  Urine culture     Status: Abnormal   Collection Time: 08/23/16  8:19 PM  Result Value Ref Range Status   Specimen Description URINE, RANDOM  Final   Special Requests NONE  Final   Culture MULTIPLE SPECIES PRESENT, SUGGEST RECOLLECTION (A)  Final   Report Status 08/25/2016 FINAL  Final  CSF culture     Status: None (Preliminary result)   Collection Time: 08/23/16  9:50 PM  Result Value Ref Range Status   Specimen Description CSF  Final   Special Requests Normal  Final   Gram Stain   Final    CYTOSPIN SMEAR WBC PRESENT,BOTH PMN AND MONONUCLEAR NO ORGANISMS SEEN    Culture NO GROWTH 1 DAY  Final   Report Status PENDING  Incomplete  Culture, blood (x 2)     Status: None (Preliminary result)   Collection Time: 08/24/16  3:42 AM  Result Value Ref Range Status   Specimen Description BLOOD LEFT ARM  Final   Special Requests BOTTLES DRAWN AEROBIC ONLY  Final   Culture PENDING  Incomplete   Report Status PENDING  Incomplete  Culture, blood (x 2)     Status: None (Preliminary result)   Collection Time: 08/24/16  3:46 AM  Result Value Ref Range Status   Specimen Description BLOOD RIGHT HAND  Final   Special Requests BOTTLES DRAWN AEROBIC ONLY  Final   Culture PENDING  Incomplete   Report Status PENDING  Incomplete     FURTHER DISCHARGE INSTRUCTIONS:  Get Medicines reviewed and adjusted: Please take all your medications with you for your next visit with your Primary MD  Laboratory/radiological data: Please request your Primary MD to go over all hospital tests and procedure/radiological results at the follow up, please ask your Primary MD to  get all Hospital records sent to his/her office.  In some cases, they will be blood work, cultures and biopsy results pending at the time of your discharge. Please request that your primary care M.D. goes through all the records of your hospital data and follows up on these results.  Also Note the following: If you experience worsening of your admission symptoms, develop shortness of breath, life threatening emergency, suicidal or homicidal thoughts you must seek medical attention immediately by calling 911 or calling your MD immediately  if symptoms less severe.  You must read complete instructions/literature along with all the possible adverse reactions/side effects for all the Medicines you take and that have been prescribed to you. Take any new Medicines after you have completely understood and accpet all the possible adverse reactions/side effects.   Do not drive when taking Pain medications or sleeping medications (Benzodaizepines)  Do not take more than prescribed Pain, Sleep and Anxiety Medications. It is not advisable to combine anxiety,sleep and pain medications without talking with your primary care practitioner  Special Instructions: If you have smoked or chewed Tobacco  in the last 2 yrs please stop smoking, stop any regular Alcohol  and or any Recreational drug use.  Wear Seat belts while driving.  Please note: You were cared for by a hospitalist during your hospital stay. Once you are discharged, your primary care physician will handle any further medical issues. Please note that NO REFILLS for any discharge medications will be authorized once you  are discharged, as it is imperative that you return to your primary care physician (or establish a relationship with a primary care physician if you do not have one) for your post hospital discharge needs so that they can reassess your need for medications and monitor your lab values.  Total Time spent coordinating discharge including counseling, education and face to face time equals 45 minutes.  SignedJeoffrey Massed: Harlie Buening 08/25/2016 2:24 PM

## 2016-08-25 NOTE — Progress Notes (Signed)
Discharge summary and medications reviewed with patient; all questions answered. Printed prescription given to patient. This RN stressed the extreme importance of following up with PCP to discuss pending tests/results.  Patient voiced understanding.

## 2016-08-25 NOTE — Progress Notes (Signed)
INFECTIOUS DISEASE PROGRESS NOTE  ID: Patricia Webster is a 20 y.o. female with  Principal Problem:   Meningitis Active Problems:   Anxiety state   Sepsis (HCC)   Asthma  Subjective: C/o not being able to sleep comfortably in hospital bed.  Denies headache, neck pain, photophobia  Abtx:  Anti-infectives    Start     Dose/Rate Route Frequency Ordered Stop   08/24/16 1000  acyclovir (ZOVIRAX) 750 mg in dextrose 5 % 150 mL IVPB     750 mg 165 mL/hr over 60 Minutes Intravenous Every 8 hours 08/24/16 0241     08/24/16 0230  acyclovir (ZOVIRAX) 750 mg in dextrose 5 % 150 mL IVPB     750 mg 165 mL/hr over 60 Minutes Intravenous  Once 08/24/16 0226 08/24/16 0532   08/24/16 0200  vancomycin (VANCOCIN) IVPB 1000 mg/200 mL premix  Status:  Discontinued     1,000 mg 200 mL/hr over 60 Minutes Intravenous Once 08/24/16 0149 08/24/16 0159   08/24/16 0200  cefTRIAXone (ROCEPHIN) 2 g in dextrose 5 % 50 mL IVPB  Status:  Discontinued     2 g 100 mL/hr over 30 Minutes Intravenous Every 24 hours 08/24/16 0149 08/24/16 0159      Medications:  Scheduled: . acyclovir  750 mg Intravenous Q8H  . enoxaparin (LOVENOX) injection  40 mg Subcutaneous Q24H  . sodium chloride flush  3 mL Intravenous Q12H    Objective: Vital signs in last 24 hours: Temp:  [98.1 F (36.7 C)-99 F (37.2 C)] 98.6 F (37 C) (10/08 0931) Pulse Rate:  [71-101] 71 (10/08 0931) Resp:  [16-18] 17 (10/08 0931) BP: (101-126)/(39-68) 104/39 (10/08 0931) SpO2:  [100 %] 100 % (10/08 0931)   General appearance: alert, cooperative and no distress Eyes: negative findings: conjunctivae and sclerae normal, pupils equal, round, reactive to light and accomodation and no photophobia Neck: supple, symmetrical, trachea midline and FROM Resp: clear to auscultation bilaterally Cardio: regular rate and rhythm GI: normal findings: bowel sounds normal and soft, non-tender  Lab Results  Recent Labs  08/23/16 1415 08/24/16 0348    WBC 9.4 6.9  HGB 13.5 12.9  HCT 41.9 40.4  NA 136 138  K 4.0 4.0  CL 107 112*  CO2 22 20*  BUN 8 9  CREATININE 0.80 0.60   Liver Panel  Recent Labs  08/23/16 1415  PROT 7.0  ALBUMIN 3.6  AST 21  ALT 19  ALKPHOS 52  BILITOT 0.8   Sedimentation Rate No results for input(s): ESRSEDRATE in the last 72 hours. C-Reactive Protein No results for input(s): CRP in the last 72 hours.  Microbiology: Recent Results (from the past 240 hour(s))  Urine culture     Status: Abnormal   Collection Time: 08/23/16  8:19 PM  Result Value Ref Range Status   Specimen Description URINE, RANDOM  Final   Special Requests NONE  Final   Culture MULTIPLE SPECIES PRESENT, SUGGEST RECOLLECTION (A)  Final   Report Status 08/25/2016 FINAL  Final  CSF culture     Status: None (Preliminary result)   Collection Time: 08/23/16  9:50 PM  Result Value Ref Range Status   Specimen Description CSF  Final   Special Requests Normal  Final   Gram Stain   Final    CYTOSPIN SMEAR WBC PRESENT,BOTH PMN AND MONONUCLEAR NO ORGANISMS SEEN    Culture NO GROWTH 1 DAY  Final   Report Status PENDING  Incomplete  Culture, blood (x 2)  [phone] Hail) 532-[place] [phone] SKenton [phone]ll [phone]bony Hail29780nNaval Branch Health Clinic Bangorral Hospi41063Sta(213Kn1610x SKenton [phone][1610960Ermalene Searingty79 SurgeV343980r72tS724-16196-2Statisticiannds757-Ebony Hail6LL 6660139 672.952llMarch([phone]) 6344-5Ebony Hail1aHillBloomfiPhilipp OvensAsc 343980rs 91478Richmohttps://ellis-tucMarland KitchenkerAlvia Groven ePhilipp OvensAlvi it91478Ri91478Richmohttps://ellis-tucMarland KitchenkerAlvia GroveE ntral WesterDennisonSurgicare Of St Andrews Ltdcotetts Healthcare37 [id number] SlPatrcia DoSt. Joseph Medical Center9erJimm34CaJimmZollie Beckersootmaneral [phone] SladPatrcia DollycMarlanWashington Hospital - Fremontmy Copa47heJimmy FoZollie Beckersannter Incu [id number]://ellis-tucMarland KitchenkerAlvia Grovea sssta XTTAG>iso91478Richmohttps://ellis-tucMarland KitchenkerAlvia Grover l Health Carecota DoCitrus [phone] SladePatrcia DollyFootma63CCammy CopaZollSaNaperville Surgical Centrequin La34seJimmyZollie Beckersotman2w0dSoutheasthealth Center Of Reynolds CountyEbony [phone] [phone] Range30mArma Heading56WisconsinKoreaHaskell [id number]

## 2016-08-25 NOTE — Progress Notes (Signed)
Clinically significantly improved No fever, headache or neck pain CSF cultures negative so far Spoke with Dr. Hatcher-recommends transition to oral Valtrex for 1 week-and okay to discharge home from his point of view. Have asked patient to follow up pending labs with PCP See discharge summary for further details

## 2016-08-26 LAB — GC/CHLAMYDIA PROBE AMP (~~LOC~~) NOT AT ARMC
Chlamydia: NEGATIVE
NEISSERIA GONORRHEA: NEGATIVE

## 2016-08-26 LAB — PATHOLOGIST SMEAR REVIEW
PATH REVIEW: INCREASED
Path Review: INCREASED

## 2016-08-26 LAB — VDRL, CSF: SYPHILIS VDRL QUANT CSF: NONREACTIVE

## 2016-08-27 LAB — CSF CULTURE: SPECIAL REQUESTS: NORMAL

## 2016-08-27 LAB — CSF CULTURE W GRAM STAIN: Culture: NO GROWTH

## 2016-08-27 LAB — ANTINUCLEAR ANTIBODIES, IFA: ANTINUCLEAR ANTIBODIES, IFA: NEGATIVE

## 2016-08-29 LAB — CULTURE, BLOOD (ROUTINE X 2)
CULTURE: NO GROWTH
Culture: NO GROWTH

## 2016-08-30 ENCOUNTER — Ambulatory Visit (INDEPENDENT_AMBULATORY_CARE_PROVIDER_SITE_OTHER): Payer: Medicaid Other | Admitting: Internal Medicine

## 2016-08-30 VITALS — BP 122/73 | HR 86 | Temp 98.0°F | Ht 69.0 in | Wt 227.0 lb

## 2016-08-30 DIAGNOSIS — G039 Meningitis, unspecified: Secondary | ICD-10-CM

## 2016-08-30 LAB — BASIC METABOLIC PANEL WITH GFR
BUN: 9 mg/dL (ref 7–20)
CALCIUM: 9.6 mg/dL (ref 8.9–10.4)
CO2: 26 mmol/L (ref 20–31)
Chloride: 102 mmol/L (ref 98–110)
Creat: 0.76 mg/dL (ref 0.50–1.00)
GFR, Est African American: >89 mL/min (ref >=60)
GLUCOSE: 84 mg/dL (ref 65–99)
Potassium: 3.9 mmol/L (ref 3.8–5.1)
SODIUM: 136 mmol/L (ref 135–146)

## 2016-08-30 LAB — CBC
HCT: 42.2 % (ref 35.0–45.0)
Hemoglobin: 13.7 g/dL (ref 11.7–15.5)
MCH: 27.3 pg (ref 27.0–33.0)
MCHC: 32.5 g/dL (ref 32.0–36.0)
MCV: 84.2 fL (ref 80.0–100.0)
MPV: 10.1 fL (ref 7.5–12.5)
Platelets: 288 10*3/uL (ref 140–400)
RBC: 5.01 MIL/uL (ref 3.80–5.10)
RDW: 13.6 % (ref 11.0–15.0)
WBC: 6.3 10*3/uL (ref 3.8–10.8)

## 2016-08-30 MED ORDER — VALACYCLOVIR HCL 1 G PO TABS: 1000.0000 mg | ORAL_TABLET | Freq: Three times a day (TID) | ORAL | 0 refills | Status: DC

## 2016-08-30 NOTE — Progress Notes (Deleted)
   [facility] Rainie Crenshaw, MD Phone: [phone]  Reason For Visit:   # *** -   Past Medical History Reviewed problem list.  Medications- reviewed and updated No additions to family history Social history- patient is a *** smoker  Objective: There were no vitals taken for this visit. Gen: NAD, alert, cooperative with exam HEENT: Normal    Neck: No masses palpated. No lymphadenopathy    Ears: Tympanic membranes intact, normal light reflex, no erythema, no bulging    Eyes: PERRLA, EOMI    Nose: nasal turbinates moist    Throat: moist mucus membranes, no erythema Cardio: regular rate and rhythm, S1S2 heard, no murmurs appreciated Pulm: clear to auscultation bilaterally, no wheezes, rhonchi or rales GI: soft, non-tender, non-distended, bowel sounds present, no hepatomegaly, no splenomegaly GU: external vaginal tissue ***, cervix ***, *** punctate lesions on cervix appreciated, *** discharge from cervical os, *** bleeding, *** cervical motion tenderness, *** abdominal/ adnexal masses Extremities: warm, well perfused, No edema, cyanosis or clubbing;  MSK: Normal gait and station Skin: dry, intact, no rashes or lesions Neuro: Strength and sensation grossly intact   Assessment/Plan: See problem based a/p  

## 2016-08-30 NOTE — Patient Instructions (Signed)
Please take valacylovir for another 5 days and you will be finished. Follow up with me as needed.

## 2016-08-30 NOTE — Progress Notes (Signed)
   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 Patient received 7 days of acyclovir/valtrex. Patient denies any sequela including headaches, neck pain, vision or hearing changes. She is doing well overall, and has no concerns.   Past Medical History Reviewed problem list.  Medications- reviewed and updated No additions to family history Social history- patient is a non-smoker  Objective: BP 122/73   Pulse 86   Temp 98 F (36.7 C) (Oral)   Ht 5\' 9"  (1.753 m)   Wt 227 lb (103 kg)   BMI 33.52 kg/m  Gen: NAD, alert, cooperative with exam HEENT: Normal    Neck: No masses palpated. No lymphadenopathy    Ears: Tympanic membranes intact, normal light reflex, no erythema, no bulging MSK: Normal gait and station Skin: dry, intact, no rashes or lesions Neuro: Strength and sensation grossly intact  Assessment/Plan: See problem based a/p  Meningitis Hospital discharge follow up for Viral meningitis, no sequale. Doing well. Received a total of 9 days of treatment for antivirals. Per review of uptodate recommendation is 10-14 days - Will treat for max recommended time- provide 5 more days of valtrex  - Follow up if any further concerns.

## 2016-09-02 NOTE — Assessment & Plan Note (Addendum)
Hospital discharge follow up for Viral meningitis, no sequale. Doing well. Received a total of 9 days of treatment for antivirals. Per review of uptodate recommendation is 10-14 days - Will treat for max recommended time- provide 5 more days of valtrex  - Follow up if any further concerns.

## 2017-01-15 ENCOUNTER — Other Ambulatory Visit: Payer: Self-pay | Admitting: Internal Medicine

## 2017-01-22 ENCOUNTER — Ambulatory Visit: Payer: Medicaid Other | Admitting: Internal Medicine

## 2017-01-24 IMAGING — MR MR HEAD W/O CM
8 of 10 series · 35 of 48 positions shown · non-contrast
Comparison: Prior CT from 01/20/2016.

CLINICAL DATA: Initial evaluation for left-sided blurry vision.
History of migraines.

EXAM:
MRI HEAD WITHOUT CONTRAST
TECHNIQUE: Multiplanar, multiecho pulse sequences of the brain and surrounding
structures were obtained without intravenous contrast.

[Series 3: T1 · sagittal · 5.0mm · 0.47mm/px · 2 of 25 slices shown]
[im 1/25]
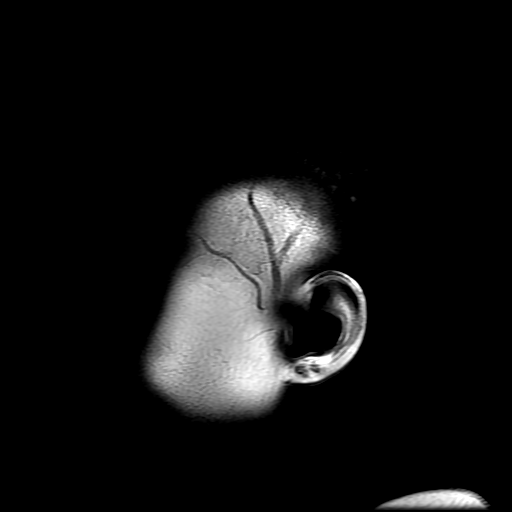
[im 25/25]
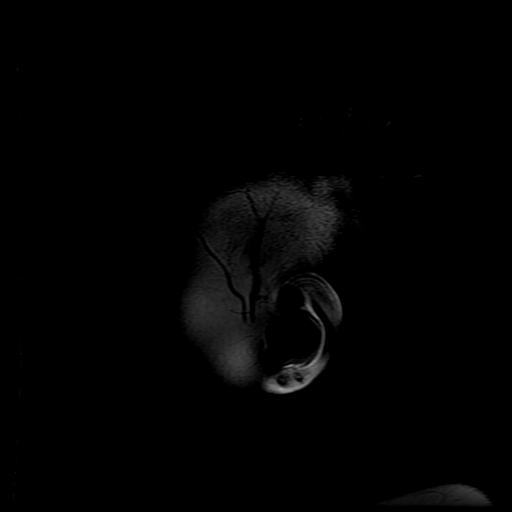

[Series 4: DWI · axial · 3.0mm · 1.09mm/px · z∈[-62,+76]mm · 9 of 94 slices shown (1 of 4)]
[im 1/94]
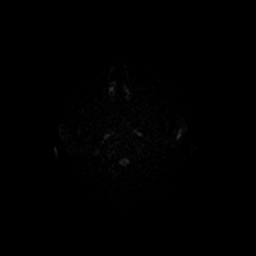
[im 12/94]
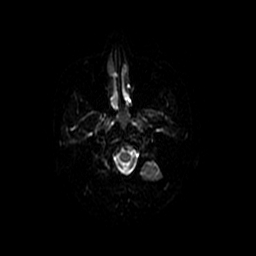
[im 24/94]
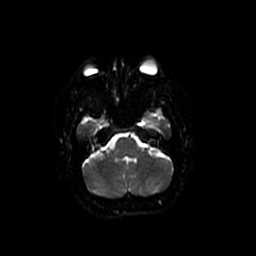
[im 35/94]
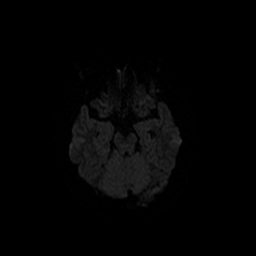
[im 47/94]
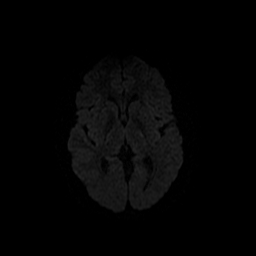
[im 59/94]
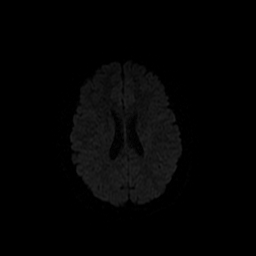
[im 70/94]
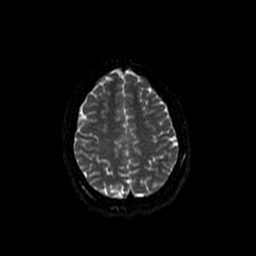
[im 82/94]
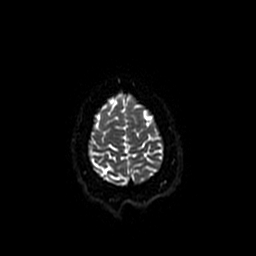
[im 94/94]
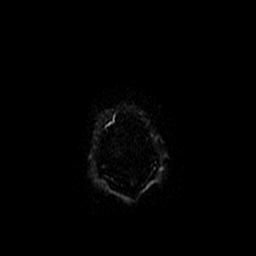

[Series 5: DWI · coronal · 5.0mm · 1.09mm/px · 7 of 66 slices shown (2 of 4)]
[im 1/66]
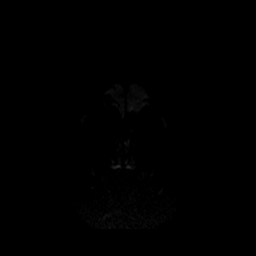
[im 11/66]
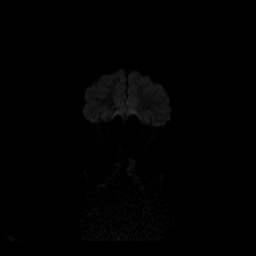
[im 22/66]
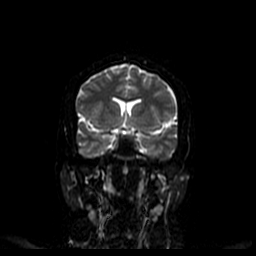
[im 33/66]
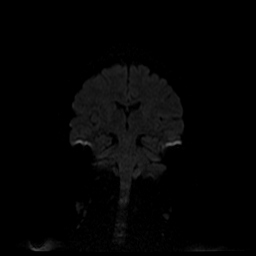
[im 44/66]
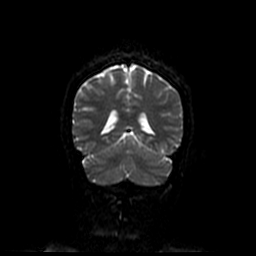
[im 55/66]
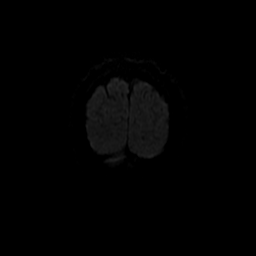
[im 66/66]
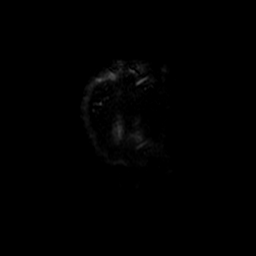

[Series 6: T2 · axial · 5.0mm · 0.43mm/px · z∈[-60,+77]mm · 3 of 24 slices shown (1 of 2)]
[im 1/24]
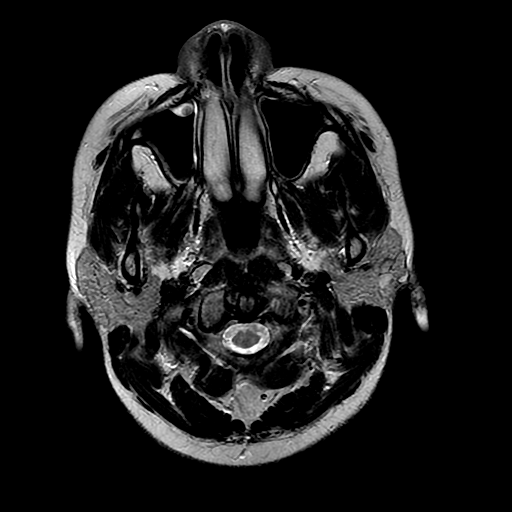
[im 12/24]
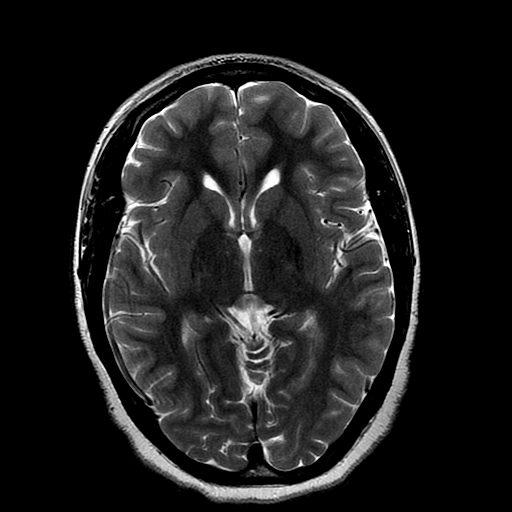
[im 24/24]
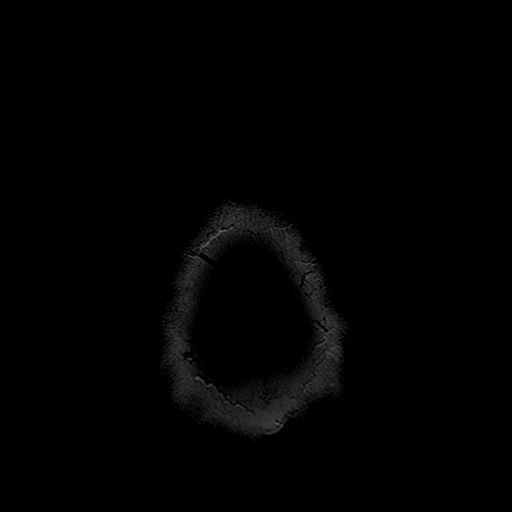

[Series 7: FLAIR · axial · 5.0mm · 0.43mm/px · z∈[-60,+77]mm · 3 of 24 slices shown]
[im 1/24]
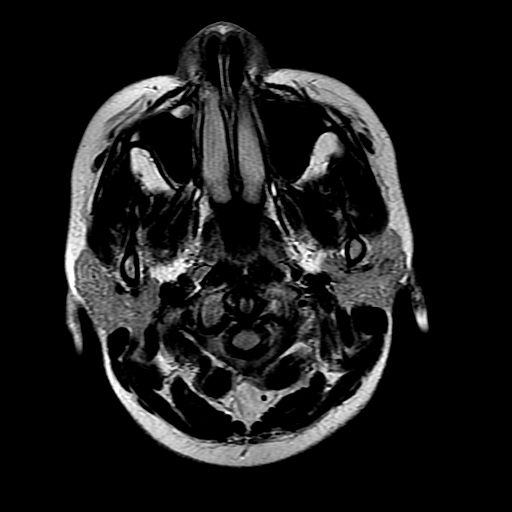
[im 12/24]
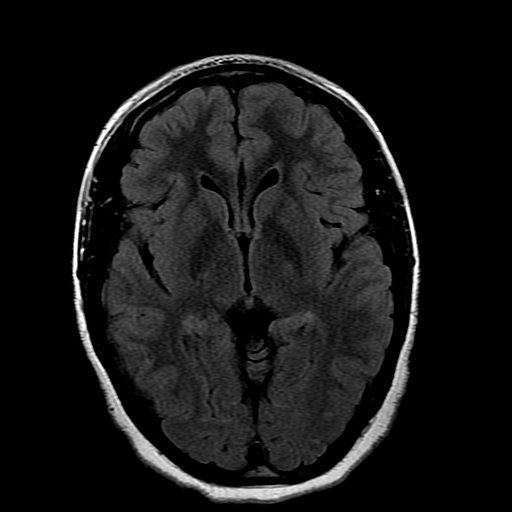
[im 24/24]
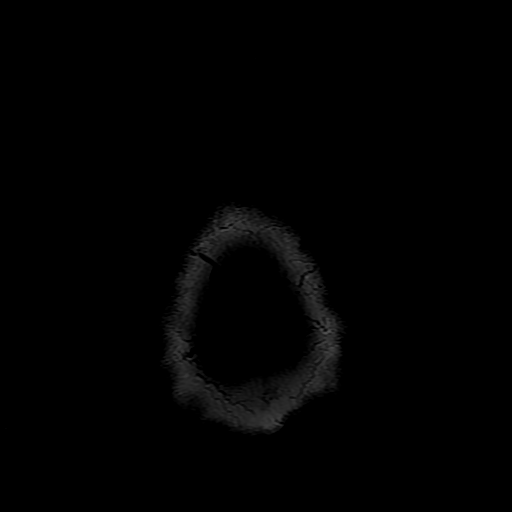

[Series 10: T2 · coronal · 5.0mm · 0.39mm/px · 3 of 25 slices shown (2 of 2)]
[im 1/25]
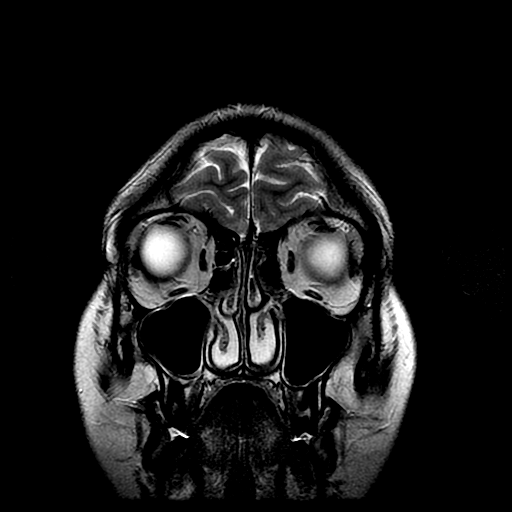
[im 13/25]
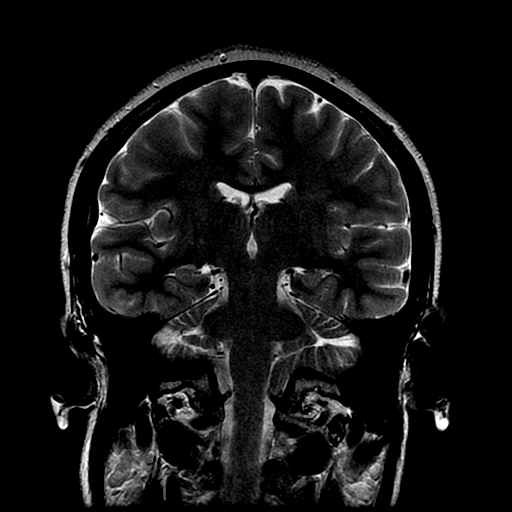
[im 25/25]
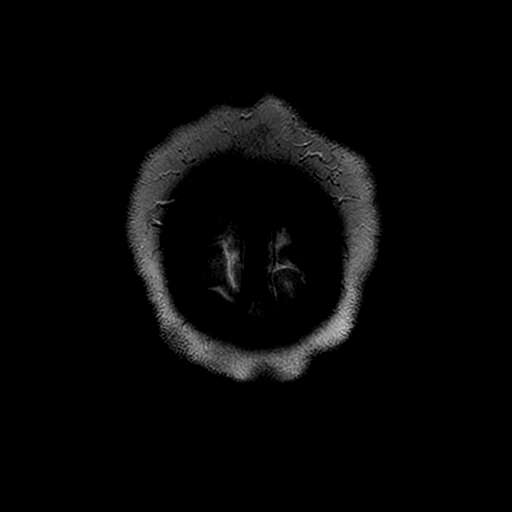

[Series 400: DWI · axial · 3.0mm · 1.09mm/px · z∈[-62,+76]mm · 5 of 47 slices shown (3 of 4)]
[im 1/47]
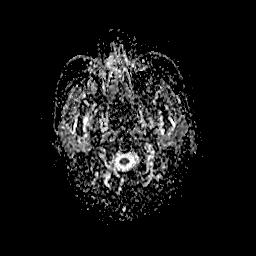
[im 12/47]
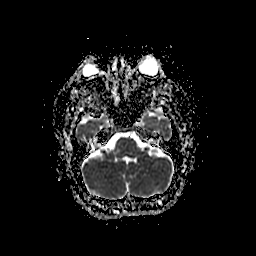
[im 24/47]
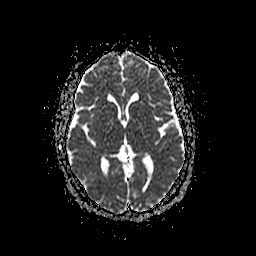
[im 35/47]
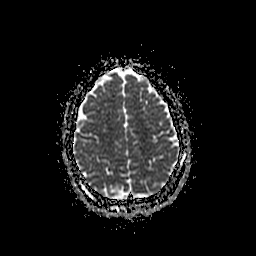
[im 47/47]
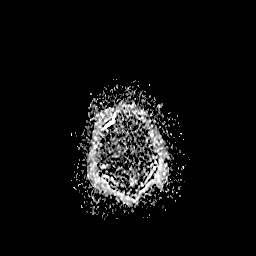

[Series 500: DWI · coronal · 5.0mm · 1.09mm/px · 3 of 33 slices shown (4 of 4)]
[im 1/33]
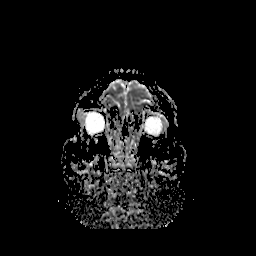
[im 17/33]
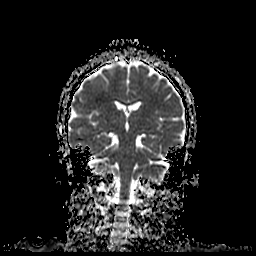
[im 33/33]
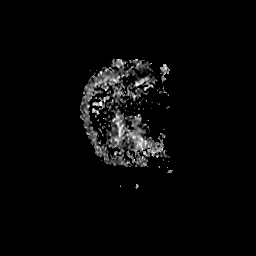

[35 of 48 positions shown; findings below may reference images not displayed]

FINDINGS: The CSF containing spaces are within normal limits for patient age.
No focal parenchymal signal abnormality is identified. No mass
lesion, midline shift, or extra-axial fluid collection. Ventricles
are normal in size without evidence of hydrocephalus.

No diffusion-weighted signal abnormality is identified to suggest
acute intracranial infarct. Gray-white matter differentiation is
maintained. Normal flow voids are seen within the intracranial
vasculature. No intracranial hemorrhage identified.

The cervicomedullary junction is normal. Pituitary gland is within
normal limits. Pituitary stalk is midline. The globes and optic
nerves demonstrate a normal appearance with normal signal intensity.

The bone marrow signal intensity is normal. Calvarium is intact.
Visualized upper cervical spine is within normal limits.

Scalp soft tissues are unremarkable.

Mild mucosal thickening throughout the paranasal sinuses. Small
retention cyst within the right maxillary sinus. No mastoid
effusion. Inner ear structures grossly normal.
IMPRESSION: Normal brain MRI. No acute intracranial infarct or other process
identified.

## 2017-02-06 ENCOUNTER — Encounter (HOSPITAL_COMMUNITY): Payer: Self-pay | Admitting: Emergency Medicine

## 2017-02-06 DIAGNOSIS — J45909 Unspecified asthma, uncomplicated: Secondary | ICD-10-CM | POA: Diagnosis not present

## 2017-02-06 DIAGNOSIS — Z79899 Other long term (current) drug therapy: Secondary | ICD-10-CM | POA: Diagnosis not present

## 2017-02-06 DIAGNOSIS — R0789 Other chest pain: Secondary | ICD-10-CM | POA: Diagnosis not present

## 2017-02-06 DIAGNOSIS — R0602 Shortness of breath: Secondary | ICD-10-CM | POA: Diagnosis present

## 2017-02-06 LAB — CBC
HCT: 41.2 % (ref 36.0–46.0)
HEMOGLOBIN: 13.7 g/dL (ref 12.0–15.0)
MCH: 28.1 pg (ref 26.0–34.0)
MCHC: 33.3 g/dL (ref 30.0–36.0)
MCV: 84.4 fL (ref 78.0–100.0)
Platelets: 185 10*3/uL (ref 150–400)
RBC: 4.88 MIL/uL (ref 3.87–5.11)
RDW: 13.4 % (ref 11.5–15.5)
WBC: 6.4 10*3/uL (ref 4.0–10.5)

## 2017-02-06 NOTE — ED Triage Notes (Signed)
Pt. reports intermittent mild central chest pain / pressure with SOB onset this evening , denies emesis or diaphoresis .

## 2017-02-07 ENCOUNTER — Emergency Department (HOSPITAL_COMMUNITY)
Admission: EM | Admit: 2017-02-07 | Discharge: 2017-02-07 | Disposition: A | Discharge status: Home / Self Care | Payer: Medicaid Other | Attending: Emergency Medicine | Admitting: Emergency Medicine

## 2017-02-07 ENCOUNTER — Emergency Department (HOSPITAL_COMMUNITY): Payer: Medicaid Other

## 2017-02-07 DIAGNOSIS — R0602 Shortness of breath: Secondary | ICD-10-CM

## 2017-02-07 DIAGNOSIS — R0789 Other chest pain: Secondary | ICD-10-CM

## 2017-02-07 LAB — BASIC METABOLIC PANEL
ANION GAP: 10 (ref 5–15)
BUN: 14 mg/dL (ref 6–20)
CHLORIDE: 103 mmol/L (ref 101–111)
CO2: 24 mmol/L (ref 22–32)
Calcium: 9.3 mg/dL (ref 8.9–10.3)
Creatinine, Ser: 0.73 mg/dL (ref 0.44–1.00)
GFR calc Af Amer: >60 mL/min (ref >60)
GFR calc non Af Amer: >60 mL/min (ref >60)
GLUCOSE: 85 mg/dL (ref 65–99)
POTASSIUM: 3.4 mmol/L — AB (ref 3.5–5.1)
Sodium: 137 mmol/L (ref 135–145)

## 2017-02-07 LAB — I-STAT TROPONIN, ED: Troponin i, poc: 0 ng/mL (ref 0.00–0.08)

## 2017-02-07 NOTE — ED Provider Notes (Signed)
[facility] Provider Note   CSN: [id number] Arrival date & time: 02/06/17  2322   By signing my name below, I, Clovis Pu, attest that this documentation has been prepared under the direction and in the presence of Tomasita Crumble, MD  Electronically Signed: Clovis Pu, ED Scribe. 02/07/17. 2:39 AM.   History   Chief Complaint Chief Complaint  Patient presents with  . Chest Pain  . Shortness of Breath    HPI Comments:  Patricia Webster is a 21 y.o. female, with a PMHx of who presents to the Emergency Department complaining of acute onset, gradually worsening SOB x yesterday. She also reports acute onset, sharp chest pain which she describes as being quick that began after laying down and a headache. She notes she cannot tell if her chest pain is due to her anxiety. She states she has been experiencing intermittent chest pain since she was diagnosed with anxiety. Pt is not compliant with her anxiety medications. She notes she has taken the medications once and has not touched them since. No alleviating factors noted. Pt denies vomiting, diarrhea, rhinorrhea, a cough or any other associated symptoms. No other complaints noted.   PCP: Danella Maiers, MD  The history is provided by the patient. No language interpreter was used.    Past Medical History:  Diagnosis Date  . Anxiety   . Asthma     Patient Active Problem List   Diagnosis Date Noted  . Meningitis 08/24/2016  . Depression 08/24/2016  . Sepsis (HCC) 08/24/2016  . Asthma 08/24/2016  . Anxiety state 05/07/2016    History reviewed. No pertinent surgical history.  OB History    Gravida Para Term Preterm AB Living   0 0 0 0 0 0   SAB TAB Ectopic Multiple Live Births   0 0 0 0         Home Medications    Prior to Admission medications   Medication Sig Start Date End Date Taking? Authorizing Provider  etonogestrel (NEXPLANON) 68 MG IMPL implant 1 each by Subdermal route once. Reported on 02/05/2016   Yes  Historical Provider, MD  citalopram (CELEXA) 10 MG tablet Take 1 tablet (10 mg total) by mouth daily. Patient not taking: Reported on 08/23/2016 05/07/16   Asiyah Mayra Reel, MD  PROVENTIL HFA 108 [phone] Base) MCG/ACT inhaler INHAL;E 1- 2 PUFFS INTO THE LUNGS EVERY 6 HOURS AS NEEDED FOR WHEEZING Patient not taking: Reported on 02/07/2017 01/15/17   Asiyah Mayra Reel, MD  valACYclovir (VALTREX) 1000 MG tablet Take 1 tablet (1,000 mg total) by mouth 3 (three) times daily. Patient not taking: Reported on 02/07/2017 08/30/16   Asiyah Mayra Reel, MD    Family History Family History  Problem Relation Age of Onset  . Early death Mother   . Stroke Maternal Grandmother   . Diabetes Maternal Uncle     Social History Social History  Substance Use Topics  . Smoking status: Never Smoker  . Smokeless tobacco: Never Used  . Alcohol use No     Allergies   Patient has no known allergies.   Review of Systems Review of Systems 10 Systems reviewed and are negative for acute change except as noted in the HPI.  Physical Exam Updated Vital Signs BP 118/76 (BP Location: Left Arm)   Pulse 74   Temp 99 F (37.2 C) (Oral)   Resp 16   Ht 5\' 9"  (1.753 m)   Wt 216 lb (98 kg)   LMP 01/23/2017 (Approximate)  SpO2 97%   BMI 31.90 kg/m   Physical Exam  Constitutional: She is oriented to person, place, and time. She appears well-developed and well-nourished. No distress.  HENT:  Head: Normocephalic and atraumatic.  Nose: Nose normal.  Mouth/Throat: Oropharynx is clear and moist. No oropharyngeal exudate.  Eyes: Conjunctivae and EOM are normal. Pupils are equal, round, and reactive to light. No scleral icterus.  Neck: Normal range of motion. Neck supple. No JVD present. No tracheal deviation present. No thyromegaly present.  Cardiovascular: Normal rate, regular rhythm and normal heart sounds.  Exam reveals no gallop and no friction rub.   No murmur heard. Pulmonary/Chest: Effort normal and  breath sounds normal. No respiratory distress. She has no wheezes. She exhibits no tenderness.  Abdominal: Soft. Bowel sounds are normal. She exhibits no distension and no mass. There is no tenderness. There is no rebound and no guarding.  Musculoskeletal: Normal range of motion. She exhibits no edema or tenderness.  Lymphadenopathy:    She has no cervical adenopathy.  Neurological: She is alert and oriented to person, place, and time. No cranial nerve deficit. She exhibits normal muscle tone.  Skin: Skin is warm and dry. No rash noted. No erythema. No pallor.  Nursing note and vitals reviewed.  ED Treatments / Results  DIAGNOSTIC STUDIES:  Oxygen Saturation is 97% on RA, normal by my interpretation.    COORDINATION OF CARE:  2:38 AM Discussed treatment plan with pt at bedside and pt agreed to plan.  Labs (all labs ordered are listed, but only abnormal results are displayed) Labs Reviewed  BASIC METABOLIC PANEL - Abnormal; Notable for the following:       Result Value   Potassium 3.4 (*)    All other components within normal limits  CBC  I-STAT TROPOININ, ED    EKG  EKG Interpretation  Date/Time:  Thursday February 06 2017 23:31:21 EDT Ventricular Rate:  103 PR Interval:  164 QRS Duration: 74 QT Interval:  308 QTC Calculation: 403 R Axis:   37 Text Interpretation:  Sinus tachycardia Nonspecific T wave abnormality Abnormal ECG rate is faster Confirmed by Erroll Luna [phone]) on 02/07/2017 1:46:34 AM       Radiology Dg Chest 2 View  Result Date: 02/07/2017 CLINICAL DATA:  21 year old female with chest pain and shortness of breath EXAM: CHEST  2 VIEW COMPARISON:  Chest radiograph dated 04/06/2016 FINDINGS: The heart size and mediastinal contours are within normal limits. Both lungs are clear. The visualized skeletal structures are unremarkable. IMPRESSION: No active cardiopulmonary disease. Electronically Signed   By: Elgie Collard M.D.   On: 02/07/2017 00:55     Procedures Procedures (including critical care time)  Medications Ordered in ED Medications - No data to display   Initial Impression / Assessment and Plan / ED Course  I have reviewed the triage vital signs and the nursing notes.  Pertinent labs & imaging results that were available during my care of the patient were reviewed by me and considered in my medical decision making (see chart for details).      Patient presents to the ED for CP and SOB going on throughout the day. History is not consistent with ACS, troponin ordered by triage and not clinically indicated.  It was negative.  EKG did not show any ischemia.  CXR and remainder of labs are normal.  Patient has been diagnosed with anxiety by PCP, she was advised to follow up with her PCP within 3 days and to actually  take the medications prescribed to her.  She states that she will now try them.  She appears well and in NAD.  She is PERC negative.  VS remain within her normal limits and she is safe for DC.     Final Clinical Impressions(s) / ED Diagnoses   Final diagnoses:  None    New Prescriptions New Prescriptions   No medications on file     I personally performed the services described in this documentation, which was scribed in my presence. The recorded information has been reviewed and is accurate.       Tomasita CrumbleAdeleke Nevaen Tredway, MD 02/07/17 [phone]

## 2017-09-23 ENCOUNTER — Ambulatory Visit: Payer: Self-pay | Admitting: Student in an Organized Health Care Education/Training Program

## 2018-01-08 ENCOUNTER — Telehealth: Payer: Self-pay | Admitting: Internal Medicine

## 2018-01-08 NOTE — Telephone Encounter (Signed)
Pt has anxiety.It has been suggested she have a dog to help her with her anxiety.  She got a dog recently and it does help.  Her apt complex needs a letter from the dr stating the dog is a emotional support animal.  Please let pt know when letter is ready and she will pick it up.

## 2018-01-12 ENCOUNTER — Ambulatory Visit: Payer: Medicaid Other | Admitting: Internal Medicine

## 2018-01-12 NOTE — Telephone Encounter (Signed)
Patient needs to come for this issues. Looks like she her today so will discuss with her

## 2018-01-19 ENCOUNTER — Ambulatory Visit: Payer: Medicaid Other | Admitting: Internal Medicine

## 2018-01-22 ENCOUNTER — Ambulatory Visit (INDEPENDENT_AMBULATORY_CARE_PROVIDER_SITE_OTHER): Payer: PRIVATE HEALTH INSURANCE | Admitting: Internal Medicine

## 2018-01-22 ENCOUNTER — Encounter: Payer: Self-pay | Admitting: Internal Medicine

## 2018-01-22 DIAGNOSIS — Z309 Encounter for contraceptive management, unspecified: Secondary | ICD-10-CM

## 2018-01-22 DIAGNOSIS — Z0000 Encounter for general adult medical examination without abnormal findings: Secondary | ICD-10-CM | POA: Diagnosis not present

## 2018-01-22 DIAGNOSIS — F411 Generalized anxiety disorder: Secondary | ICD-10-CM

## 2018-01-22 NOTE — Progress Notes (Signed)
   Patricia Webster is a 22 y.o. female presents to office today for annual physical exam examination.  Concerns today include:  # GAD  -States she has been doing well she is not taking any medications.  She denies any significant anxiety.  She states that early she still has panic attacks.  However she is able to use breathing techniques to help her overcome these attacks.  Denies any depression.  Denies any SI  #Contraceptive management -Currently has a Nexplanon in place.  Women's Health  Periods: does not have menstrual periods Contraception: Nexplanon Pelvic symptoms: None  Sexual activity: yes  STD Screening: no  Diet: Working  Smoking: None  Alcohol: only once in a while will have 1 beer  Drugs: None  Mood: Doing well     Past Medical History:  Diagnosis Date  . Anxiety   . Asthma    Social History   Socioeconomic History  . Marital status: Single    Spouse name: Not on file  . Number of children: Not on file  . Years of education: Not on file  . Highest education level: Not on file  Social Needs  . Financial resource strain: Not on file  . Food insecurity - worry: Not on file  . Food insecurity - inability: Not on file  . Transportation needs - medical: Not on file  . Transportation needs - non-medical: Not on file  Occupational History  . Not on file  Tobacco Use  . Smoking status: Never Smoker  . Smokeless tobacco: Never Used  Substance and Sexual Activity  . Alcohol use: No  . Drug use: No  . Sexual activity: Yes    Birth control/protection: Implant    Comment: June 2016   Other Topics Concern  . Not on file  Social History Narrative  . Not on file   No past surgical history on file. Family History  Problem Relation Age of Onset  . Early death Mother   . Stroke Maternal Grandmother   . Diabetes Maternal Uncle     ROS: Patient reports no  vision/ hearing changes,anorexia, weight change, fever ,adenopathy, persistant / recurrent hoarseness,  swallowing issues, chest pain, edema,persistant / recurrent cough, hemoptysis, dyspnea(rest, exertional, paroxysmal nocturnal), gastrointestinal  bleeding (melena, rectal bleeding), abdominal pain, excessive heart burn, GU symptoms(dysuria, hematuria, pyuria, voiding/incontinence  Issues) syncope, focal weakness, severe memory loss, concerning skin lesions, depression, anxiety, abnormal bruising/bleeding, major joint swelling, breast masses or abnormal vaginal bleeding.    Physical exam Physical Exam  Constitutional: She is oriented to person, place, and time. She appears well-developed and well-nourished.  HENT:  Head: Normocephalic and atraumatic.  Eyes: Conjunctivae are normal. Pupils are equal, round, and reactive to light.  Neck: Normal range of motion. Neck supple.  Cardiovascular: Normal rate and regular rhythm.  Pulmonary/Chest: Effort normal and breath sounds normal.  Abdominal: Soft. Bowel sounds are normal.  Musculoskeletal: Normal range of motion.  Neurological: She is alert and oriented to person, place, and time.  Skin: Skin is warm. Capillary refill takes less than 2 seconds.  Psychiatric: She has a normal mood and affect.     Assessment/ Plan: Patient with GAD here for annual physical exam.   Anxiety state Doing well Behavioral modifications      Judah Carchi Cathlean CowerMikell PGY-3, [facility]

## 2018-01-22 NOTE — Patient Instructions (Signed)
I seen you today.  Please follow-up as needed once a year for checkup at least if you need to get your Nexplanon taken out please follow-up in the next couple months.  Make sure that they give you a 30-minute or 2 slots when you make that appointment

## 2018-01-26 NOTE — Assessment & Plan Note (Signed)
Doing well Behavioral modifications

## 2018-02-12 ENCOUNTER — Other Ambulatory Visit: Payer: Self-pay

## 2018-02-12 ENCOUNTER — Ambulatory Visit (HOSPITAL_COMMUNITY)
Admission: EM | Admit: 2018-02-12 | Discharge: 2018-02-12 | Disposition: A | Discharge status: Home / Self Care | Payer: PRIVATE HEALTH INSURANCE | Attending: Emergency Medicine | Admitting: Emergency Medicine

## 2018-02-12 ENCOUNTER — Encounter (HOSPITAL_COMMUNITY): Payer: Self-pay | Admitting: Emergency Medicine

## 2018-02-12 DIAGNOSIS — J45909 Unspecified asthma, uncomplicated: Secondary | ICD-10-CM | POA: Diagnosis not present

## 2018-02-12 DIAGNOSIS — J029 Acute pharyngitis, unspecified: Secondary | ICD-10-CM | POA: Diagnosis present

## 2018-02-12 DIAGNOSIS — B349 Viral infection, unspecified: Secondary | ICD-10-CM | POA: Diagnosis not present

## 2018-02-12 DIAGNOSIS — F419 Anxiety disorder, unspecified: Secondary | ICD-10-CM | POA: Insufficient documentation

## 2018-02-12 LAB — POCT RAPID STREP A: Streptococcus, Group A Screen (Direct): NEGATIVE

## 2018-02-12 MED ORDER — CETIRIZINE HCL 10 MG PO CAPS: 10.0000 mg | ORAL_CAPSULE | Freq: Every day | ORAL | 0 refills | Status: AC

## 2018-02-12 NOTE — ED Provider Notes (Signed)
[facility]    CSN: [id number] Arrival date & time: 02/12/18  1413     History   Chief Complaint Chief Complaint  Patient presents with  . Sore Throat    HPI Madelena Gervase is a 22 y.o. female presenting today with concern for sore throat, body aches and headache.  Symptoms began yesterday, but worsened throughout the day today.  She denies any fever, congestion, cough.  She is concerned because a classmate of hers had the flu with similar symptoms.  Tolerating oral intake well, denies nausea, vomiting, abdominal pain, diarrhea.  Patient has not taken anything for her symptoms yet.  HPI  Past Medical History:  Diagnosis Date  . Anxiety   . Anxiety   . Asthma     Patient Active Problem List   Diagnosis Date Noted  . Meningitis 08/24/2016  . Sepsis (HCC) 08/24/2016  . Asthma 08/24/2016  . Anxiety state 05/07/2016    History reviewed. No pertinent surgical history.  OB History    Gravida  0   Para  0   Term  0   Preterm  0   AB  0   Living  0     SAB  0   TAB  0   Ectopic  0   Multiple  0   Live Births               Home Medications    Prior to Admission medications   Medication Sig Start Date End Date Taking? Authorizing Provider  Cetirizine HCl 10 MG CAPS Take 1 capsule (10 mg total) by mouth daily for 15 days. 02/12/18 02/27/18  Albina Gosney C, PA-C  etonogestrel (NEXPLANON) 68 MG IMPL implant 1 each by Subdermal route once. Reported on 02/05/2016    [provider]    Family History Family History  Problem Relation Age of Onset  . Early death Mother   . Stroke Maternal Grandmother   . Diabetes Maternal Uncle     Social History Social History   Tobacco Use  . Smoking status: Never Smoker  . Smokeless tobacco: Never Used  Substance Use Topics  . Alcohol use: No  . Drug use: No     Allergies   Patient has no known allergies.   Review of Systems Review of Systems  Constitutional: Negative for chills,  fatigue and fever.  HENT: Positive for congestion, postnasal drip and sore throat. Negative for ear pain, rhinorrhea, sinus pressure and trouble swallowing.   Respiratory: Negative for cough, chest tightness and shortness of breath.   Cardiovascular: Negative for chest pain.  Gastrointestinal: Negative for abdominal pain, nausea and vomiting.  Musculoskeletal: Negative for myalgias.  Skin: Negative for rash.  Neurological: Negative for dizziness, light-headedness and headaches.     Physical Exam Triage Vital Signs ED Triage Vitals  Enc Vitals Group     BP 02/12/18 1509 117/65     Pulse Rate 02/12/18 1509 77     Resp --      Temp 02/12/18 1509 98.3 F (36.8 C)     Temp Source 02/12/18 1509 Oral     SpO2 02/12/18 1509 100 %     Weight --      Height --      Head Circumference --      Peak Flow --      Pain Score 02/12/18 1507 0     Pain Loc --      Pain Edu? --  Excl. in GC? --    No data found.  Updated Vital Signs BP 117/65 (BP Location: Left Arm)   Pulse 77   Temp 98.3 F (36.8 C) (Oral)   LMP 02/12/2018   SpO2 100%   Visual Acuity Right Eye Distance:   Left Eye Distance:   Bilateral Distance:    Right Eye Near:   Left Eye Near:    Bilateral Near:     Physical Exam  Constitutional: She appears well-developed and well-nourished. No distress.  HENT:  Head: Normocephalic and atraumatic.  Bilateral TMs nonerythematous, nasal mucosa erythematous without rhinorrhea, posterior oropharynx with evidence of postnasal drainage, tonsils moderately enlarged, but non-erythematous, no exudate  Eyes: Conjunctivae are normal.  Neck: Neck supple.  Cardiovascular: Normal rate and regular rhythm.  No murmur heard. Pulmonary/Chest: Effort normal and breath sounds normal. No respiratory distress.  Abdominal: Soft. There is no tenderness.  Musculoskeletal: She exhibits no edema.  Neurological: She is alert.  Skin: Skin is warm and dry.  Psychiatric: She has a normal mood  and affect.  Nursing note and vitals reviewed.    UC Treatments / Results  Labs (all labs ordered are listed, but only abnormal results are displayed) Labs Reviewed  CULTURE, GROUP A STREP [facility]  POCT RAPID STREP A    EKG None Radiology No results found.  Procedures Procedures (including critical care time)  Medications Ordered in UC Medications - No data to display   Initial Impression / Assessment and Plan / UC Course  I have reviewed the triage vital signs and the nursing notes.  Pertinent labs & imaging results that were available during my care of the patient were reviewed by me and considered in my medical decision making (see chart for details).     Strep test negative.  Likely throat irritation from postnasal drainage.  Will recommend daily allergy pill.  Tylenol and ibuprofen for body aches.  Likely viral illness that will resolve on its own.  Patient is stable, vital signs stable. Discussed strict return precautions. Patient verbalized understanding and is agreeable with plan.   Final Clinical Impressions(s) / UC Diagnoses   Final diagnoses:  Viral illness    ED Discharge Orders        Ordered    Cetirizine HCl 10 MG CAPS  Daily     02/12/18 1534       Controlled Substance Prescriptions  Controlled Substance Registry consulted? Not Applicable   Lew DawesWieters, Alvilda Mckenna C, [place]PA-C 02/12/18 1545

## 2018-02-12 NOTE — Discharge Instructions (Signed)
Please begin daily allergy pill, I have sent Zyrtec in, but you may compared to over-the-counter and do whatever is cheapest for you.  Please take Tylenol and ibuprofen to help with your body aches.  I expect her symptoms to gradually resolve over time.

## 2018-02-12 NOTE — ED Triage Notes (Signed)
C/o throat "irritation", body aches and HA onset yesterday

## 2018-02-15 LAB — CULTURE, GROUP A STREP (THRC)

## 2018-04-02 ENCOUNTER — Ambulatory Visit (INDEPENDENT_AMBULATORY_CARE_PROVIDER_SITE_OTHER): Payer: Medicaid Other | Admitting: Internal Medicine

## 2018-04-02 ENCOUNTER — Encounter: Payer: Self-pay | Admitting: Internal Medicine

## 2018-04-02 VITALS — BP 112/84 | HR 77 | Temp 98.1°F | Ht 69.0 in | Wt 221.0 lb

## 2018-04-02 DIAGNOSIS — Z309 Encounter for contraceptive management, unspecified: Secondary | ICD-10-CM

## 2018-04-02 DIAGNOSIS — Z3046 Encounter for surveillance of implantable subdermal contraceptive: Secondary | ICD-10-CM | POA: Diagnosis not present

## 2018-04-02 LAB — POCT URINE PREGNANCY: Preg Test, Ur: NEGATIVE

## 2018-04-02 MED ORDER — NORGESTIM-ETH ESTRAD TRIPHASIC 0.18/0.215/0.25 MG-25 MCG PO TABS: 1.0000 | ORAL_TABLET | Freq: Every day | ORAL | 11 refills | Status: AC

## 2018-04-02 MED ORDER — NORGESTIM-ETH ESTRAD TRIPHASIC 0.18/0.215/0.25 MG-25 MCG PO TABS: 1.0000 | ORAL_TABLET | Freq: Every day | ORAL | 11 refills | Status: DC

## 2018-04-02 NOTE — Progress Notes (Signed)
   [facility] [name], MD Phone: [phone]  Reason For Visit: Follow up   # Nexplanon Removal  - Placed in March 2017, patient would like it out today  - Currently sexual active  - No STD concerns   #Birth Control  - Discussed birth control options  - No hx of blood clots, or family hx, no hx of migraines with aura, no hx of blood pressure issues, does not smoke   Past Medical History Reviewed problem list.  Medications- reviewed and updated No additions to family history Social history- patient is a non-smoker  Objective: BP 112/84   Pulse 77   Temp 98.1 F (36.7 C) (Oral)   Ht  (1.753 m)   Wt 221 lb (100.2 kg)   SpO2 97%   BMI 32.64 kg/m  Gen: NAD, alert, cooperative with exam MSK: Normal gait and station Skin: dry, intact, no rashes or lesions  Progestin Implant Removal Note The patient is place in the supine position. Aseptic conditions are maintained. The rod is located by palpation. The area is cleaned with antiseptic. 5 cc of 1% lidocaine with epinephrine is injected just underneath the end of the implant closest to the elbow. After firmly pressing down on the end of the implant closer to the axilla a 2-3 mm incision is made with a scalpel. The rod is pushed to the incision site and grasped with a mosquito forceps and gently removed. Blunt dissection (WAS) needed. The patient (DID) tolerate the procedure well. The rod was removed in its entirety. The incision was dressed with a small adhesive bandage closure and a pressure dressing was applied. An alternate plan for contraception was discussed. The patient would like to use oral birth control for her contraception.    Assessment/Plan: See problem based a/p  Encounter for contraceptive management - Nexplanon Removed  - POCT urine pregnancy - Norgestimate-Ethinyl Estradiol Triphasic 0.18/0.215/0.25 MG-25 MCG tab; Take 1 tablet by mouth daily.  Dispense: 1 Package; Refill:  11

## 2018-04-02 NOTE — Patient Instructions (Signed)
You can start taking birth control immediately.

## 2018-04-03 ENCOUNTER — Encounter: Payer: Self-pay | Admitting: Internal Medicine

## 2018-04-03 DIAGNOSIS — Z309 Encounter for contraceptive management, unspecified: Secondary | ICD-10-CM | POA: Insufficient documentation

## 2018-04-03 NOTE — Assessment & Plan Note (Signed)
-   Nexplanon Removed  - POCT urine pregnancy - Norgestimate-Ethinyl Estradiol Triphasic 0.18/0.215/0.25 MG-25 MCG tab; Take 1 tablet by mouth daily.  Dispense: 1 Package; Refill: 11

## 2018-04-28 ENCOUNTER — Encounter: Payer: Self-pay | Admitting: Internal Medicine

## 2018-04-30 ENCOUNTER — Ambulatory Visit (INDEPENDENT_AMBULATORY_CARE_PROVIDER_SITE_OTHER): Payer: PRIVATE HEALTH INSURANCE | Admitting: Internal Medicine

## 2018-04-30 ENCOUNTER — Encounter: Payer: Self-pay | Admitting: Internal Medicine

## 2018-04-30 VITALS — BP 110/80 | HR 103 | Temp 99.0°F | Wt 211.0 lb

## 2018-04-30 DIAGNOSIS — F411 Generalized anxiety disorder: Secondary | ICD-10-CM

## 2018-04-30 NOTE — Assessment & Plan Note (Signed)
GAD-7 15,  -Recently broke up with her boyfriend and is now having to move out of her current apartment -States this has brought up her anxiety significantly prior her symptoms were pretty well controlled - She is not currently interested in talking to therapist or medication as she feels like this is just due to life stress that will resolve in the next week -Provided her with a letter for therapy dog -Follow-up in 1 week if no improvement in her symptoms

## 2018-04-30 NOTE — Patient Instructions (Signed)
I am going to provide you with a letter.  If things do not improve and you start to feeling  Like your anxiety is acting up significantly please return for follow-up.

## 2018-04-30 NOTE — Progress Notes (Signed)
   [facility] Patricia CharsAsiyah Nithila Sumners, MD Phone: [phone]  Reason For Visit:  SDA for Anxiety   # Anxiety  History of anxiety was previously taking citalopram for some time.  Recently broke up with boyfriend and has been feeling particularly bad.  She is tearful during visit.  She is coming in today specifically to get a letter for her dog as she feels like this helps with her anxiety.  She [place] and had a letter from them stating that she benefited from a therapy dog.  She states because she is moving out of her current apartment which she lived in with her boyfriend she needs a new letter from a physician stating that she can continue to have her dog.  She states that her  dog provides her with significant emotional support and helped her not have to use medications for her anxiety.  GAD- 7 15    Past Medical History Reviewed problem list.  Medications- reviewed and updated No additions to family history Social history- patient is a non smoker  Objective: BP 110/80   Pulse (!) 103   Temp 99 F (37.2 C)   Wt 211 lb (95.7 kg)   SpO2 99%   BMI 31.16 kg/m  Gen: NAD, alert, cooperative with exam Cardio: regular rate and rhythm, S1S2 heard, no murmurs appreciated Pulm: clear to auscultation bilaterally, no wheezes, rhonchi or rales Skin: dry, intact, no rashes or lesions   Assessment/Plan: See problem based a/p  Anxiety state GAD-7 15,  -Recently broke up with her boyfriend and is now having to move out of her current apartment -States this has brought up her anxiety significantly prior her symptoms were pretty well controlled - She is not currently interested in talking to therapist or medication as she feels like this is just due to life stress that will resolve in the next week -Provided her with a letter for therapy dog -Follow-up in 1 week if no improvement in her symptoms

## 2018-05-03 ENCOUNTER — Encounter (HOSPITAL_COMMUNITY): Payer: Self-pay | Admitting: Emergency Medicine

## 2018-05-03 ENCOUNTER — Ambulatory Visit (HOSPITAL_COMMUNITY)
Admission: EM | Admit: 2018-05-03 | Discharge: 2018-05-03 | Disposition: A | Discharge status: Home / Self Care | Payer: PRIVATE HEALTH INSURANCE | Attending: Family Medicine | Admitting: Family Medicine

## 2018-05-03 ENCOUNTER — Telehealth (HOSPITAL_COMMUNITY): Payer: Self-pay | Admitting: Emergency Medicine

## 2018-05-03 DIAGNOSIS — R509 Fever, unspecified: Secondary | ICD-10-CM

## 2018-05-03 DIAGNOSIS — R1310 Dysphagia, unspecified: Secondary | ICD-10-CM | POA: Insufficient documentation

## 2018-05-03 DIAGNOSIS — J029 Acute pharyngitis, unspecified: Secondary | ICD-10-CM | POA: Insufficient documentation

## 2018-05-03 DIAGNOSIS — J45909 Unspecified asthma, uncomplicated: Secondary | ICD-10-CM | POA: Insufficient documentation

## 2018-05-03 DIAGNOSIS — F411 Generalized anxiety disorder: Secondary | ICD-10-CM | POA: Insufficient documentation

## 2018-05-03 LAB — POCT INFECTIOUS MONO SCREEN: MONO SCREEN: NEGATIVE

## 2018-05-03 LAB — POCT RAPID STREP A: Streptococcus, Group A Screen (Direct): NEGATIVE

## 2018-05-03 MED ORDER — AMOXICILLIN 500 MG PO CAPS: 500.0000 mg | ORAL_CAPSULE | Freq: Two times a day (BID) | ORAL | 0 refills | Status: AC

## 2018-05-03 MED ORDER — AMOXICILLIN 500 MG PO CAPS: 500.0000 mg | ORAL_CAPSULE | Freq: Two times a day (BID) | ORAL | 0 refills | Status: DC

## 2018-05-03 MED ORDER — IBUPROFEN 800 MG PO TABS: 800.0000 mg | ORAL_TABLET | Freq: Three times a day (TID) | ORAL | 0 refills | Status: DC

## 2018-05-03 NOTE — ED Triage Notes (Signed)
Pt c/o sore throat with fever.

## 2018-05-03 NOTE — ED Provider Notes (Signed)
[facility]    CSN: [id number] Arrival date & time: 05/03/18  1531     History   Chief Complaint Chief Complaint  Patient presents with  . Sore Throat    HPI Patricia Webster is a 22 y.o. female history of asthma and anxiety presenting today for evaluation of sore throat and fever.  Patient developed sore throat approximately 3 to 4 days ago and is also had fever.  She took Alka-Seltzer which is helped her fever, but states that it is worse in her sore throat.  Having difficulty swallowing.  Denies associated cough, congestion or rhinorrhea.  Denies nausea and vomiting, abdominal pain.  HPI  Past Medical History:  Diagnosis Date  . Anxiety   . Anxiety   . Asthma     Patient Active Problem List   Diagnosis Date Noted  . Encounter for contraceptive management 04/03/2018  . Meningitis 08/24/2016  . Asthma 08/24/2016  . Anxiety state 05/07/2016    History reviewed. No pertinent surgical history.  OB History    Gravida  0   Para  0   Term  0   Preterm  0   AB  0   Living  0     SAB  0   TAB  0   Ectopic  0   Multiple  0   Live Births               Home Medications    Prior to Admission medications   Medication Sig Start Date End Date Taking? Authorizing Provider  amoxicillin (AMOXIL) 500 MG capsule Take 1 capsule (500 mg total) by mouth 2 (two) times daily for 10 days. 05/03/18 05/13/18  Wieters, Hallie C, PA-C  Cetirizine HCl 10 MG CAPS Take 1 capsule (10 mg total) by mouth daily for 15 days. 02/12/18 02/27/18  Wieters, Hallie C, PA-C  etonogestrel (NEXPLANON) 68 MG IMPL implant 1 each by Subdermal route once. Reported on 02/05/2016    [provider]  ibuprofen (ADVIL,MOTRIN) 800 MG tablet Take 1 tablet (800 mg total) by mouth 3 (three) times daily. 05/03/18   Wieters, Hallie C, PA-C  Norgestimate-Ethinyl Estradiol Triphasic 0.18/0.215/0.25 MG-25 MCG tab Take 1 tablet by mouth daily. 04/02/18   Berton Bon, MD    Family  History Family History  Problem Relation Age of Onset  . Early death Mother   . Stroke Maternal Grandmother   . Diabetes Maternal Uncle     Social History Social History   Tobacco Use  . Smoking status: Never Smoker  . Smokeless tobacco: Never Used  Substance Use Topics  . Alcohol use: No  . Drug use: No     Allergies   Patient has no known allergies.   Review of Systems Review of Systems  Constitutional: Positive for fever. Negative for chills and fatigue.  HENT: Positive for sore throat. Negative for congestion, ear pain, rhinorrhea, sinus pressure and trouble swallowing.   Respiratory: Negative for cough, chest tightness and shortness of breath.   Cardiovascular: Negative for chest pain.  Gastrointestinal: Negative for abdominal pain, nausea and vomiting.  Musculoskeletal: Negative for myalgias.  Skin: Negative for rash.  Neurological: Negative for dizziness, light-headedness and headaches.     Physical Exam Triage Vital Signs ED Triage Vitals [05/03/18 1542]  Enc Vitals Group     BP 97/64     Pulse Rate 99     Resp 18     Temp 100.1 F (37.8 C)  Temp src      SpO2 99 %     Weight      Height      Head Circumference      Peak Flow      Pain Score      Pain Loc      Pain Edu?      Excl. in GC?    No data found.  Updated Vital Signs BP 97/64   Pulse 99   Temp 100.1 F (37.8 C)   Resp 18   SpO2 99%   Visual Acuity Right Eye Distance:   Left Eye Distance:   Bilateral Distance:    Right Eye Near:   Left Eye Near:    Bilateral Near:     Physical Exam  Constitutional: She appears well-developed and well-nourished. No distress.  HENT:  Head: Normocephalic and atraumatic.  Bilateral ears without tenderness to palpation of external auricle, tragus and mastoid, EAC's without erythema or swelling, TM's with good bony landmarks and cone of light. Non erythematous.  Nasal mucosa erythematous with swollen turbinates, dried rhinorrhea present in  left nares  Oral mucosa pink and moist, moderate tonsillar enlargement with erythema and exudate bilaterally. Posterior pharynx patent and erythematous, no uvula deviation or swelling.  Voice is hoarse   Eyes: Conjunctivae are normal.  Neck: Neck supple.  Cardiovascular: Normal rate and regular rhythm.  No murmur heard. Pulmonary/Chest: Effort normal and breath sounds normal. No respiratory distress.  Breathing comfortably at rest, CTABL, no wheezing, rales or other adventitious sounds auscultated  Abdominal: Soft. There is no tenderness.  Musculoskeletal: She exhibits no edema.  Neurological: She is alert.  Skin: Skin is warm and dry.  Psychiatric: She has a normal mood and affect.  Nursing note and vitals reviewed.    UC Treatments / Results  Labs (all labs ordered are listed, but only abnormal results are displayed) Labs Reviewed  CULTURE, GROUP A STREP [facility])  POCT RAPID STREP A  POCT INFECTIOUS MONO SCREEN    EKG None  Radiology No results found.  Procedures Procedures (including critical care time)  Medications Ordered in UC Medications - No data to display  Initial Impression / Assessment and Plan / UC Course  I have reviewed the triage vital signs and the nursing notes.  Pertinent labs & imaging results that were available during my care of the patient were reviewed by me and considered in my medical decision making (see chart for details).     Strep and mono both negative today in clinic.  No signs of peritonsillar abscess.  Airway maintained.  Given appearance of tonsils on exam we will go ahead and initiate treatment with antibiotics given patient also with fever.  Discussed OTC recommendations to further manage sore throat.Discussed strict return precautions. Patient verbalized understanding and is agreeable with plan.  Final Clinical Impressions(s) / UC Diagnoses   Final diagnoses:  Sore throat     Discharge Instructions     Sore Throat  Your  rapid strep and mono tested Negative today, but I am going to go ahead and treat you with amoxicillin based off exam.  Please continue Tylenol or Ibuprofen for fever and pain. May try salt water gargles, cepacol lozenges, throat spray, or OTC cold relief medicine for throat discomfort. If you also have congestion take a daily anti-histamine like Zyrtec, Claritin, and a oral decongestant to help with post nasal drip that may be irritating your throat.   Stay hydrated and drink plenty of fluids  to keep your throat coated relieve irritation.     ED Prescriptions    Medication Sig Dispense Auth. Provider   amoxicillin (AMOXIL) 500 MG capsule Take 1 capsule (500 mg total) by mouth 2 (two) times daily for 10 days. 20 capsule Wieters, Hallie C, PA-C   ibuprofen (ADVIL,MOTRIN) 800 MG tablet Take 1 tablet (800 mg total) by mouth 3 (three) times daily. 21 tablet Wieters, ClintonHallie C, PA-C     Controlled Substance Prescriptions [place] Controlled Substance Registry consulted? Not Applicable   Lew DawesWieters, Hallie C, [place]PA-C 05/03/18 1623

## 2018-05-03 NOTE — Discharge Instructions (Signed)
Sore Throat  Your rapid strep and mono tested Negative today, but I am going to go ahead and treat you with amoxicillin based off exam.  Please continue Tylenol or Ibuprofen for fever and pain. May try salt water gargles, cepacol lozenges, throat spray, or OTC cold relief medicine for throat discomfort. If you also have congestion take a daily anti-histamine like Zyrtec, Claritin, and a oral decongestant to help with post nasal drip that may be irritating your throat.   Stay hydrated and drink plenty of fluids to keep your throat coated relieve irritation.

## 2018-05-06 ENCOUNTER — Telehealth (HOSPITAL_COMMUNITY): Payer: Self-pay

## 2018-05-06 LAB — CULTURE, GROUP A STREP (THRC)

## 2018-05-06 NOTE — Telephone Encounter (Signed)
Culture is positive for non group A Strep germ.  This is a finding of uncertain significance; not the typical 'strep throat' germ.  Pt is already on tx. Pt called and made aware. Verbalized understanding.

## 2018-05-19 ENCOUNTER — Telehealth: Payer: Self-pay | Admitting: Family Medicine

## 2018-05-19 NOTE — Telephone Encounter (Signed)
Placed in MDs box. Ryanna Teschner, CMA  

## 2018-05-19 NOTE — Telephone Encounter (Signed)
Pt came in office and dropped a Reasonable Accommodation/Modification Request Verification form, requesting to filled and signed by MD. Last DOS 04-30-2018. Best phone # to contact is [phone]. Form was placed in [employer] folder.

## 2018-05-20 NOTE — Telephone Encounter (Signed)
Form completed and placed in RN box.  Orpah ClintonSherin Chrystopher Stangl, DO, PGY-2 [facility] 05/20/2018 12:21 PM

## 2018-05-20 NOTE — Telephone Encounter (Signed)
Patient aware available for pick up at front desk. Copy made for batch scanning. Ples SpecterAlisa Brake, RN [facility] RN)

## 2018-06-22 ENCOUNTER — Ambulatory Visit (HOSPITAL_COMMUNITY)
Admission: EM | Admit: 2018-06-22 | Discharge: 2018-06-22 | Disposition: A | Discharge status: Home / Self Care | Payer: PRIVATE HEALTH INSURANCE | Attending: Family Medicine | Admitting: Family Medicine

## 2018-06-22 ENCOUNTER — Encounter: Payer: Self-pay | Admitting: Emergency Medicine

## 2018-06-22 DIAGNOSIS — R0602 Shortness of breath: Secondary | ICD-10-CM | POA: Diagnosis not present

## 2018-06-22 DIAGNOSIS — J029 Acute pharyngitis, unspecified: Secondary | ICD-10-CM | POA: Diagnosis not present

## 2018-06-22 NOTE — ED Provider Notes (Signed)
[facility]    CSN: [id number] Arrival date & time: 06/22/18  1615     History   Chief Complaint No chief complaint on file.   HPI Patricia Webster is a 22 y.o. female.   Patient had non-group A strep 3 weeks ago was given antibiotic but failed to take it all.  We talked for some some length about different strains of strep and why we would not treat it.  She has not really had any fever recently she did have a little raspy throat and some shortness of breath.  There is a history of anxiety as well as asthma.  HPI  Past Medical History:  Diagnosis Date  . Anxiety   . Anxiety   . Asthma     Patient Active Problem List   Diagnosis Date Noted  . Encounter for contraceptive management 04/03/2018  . Meningitis 08/24/2016  . Asthma 08/24/2016  . Anxiety state 05/07/2016    No past surgical history on file.  OB History    Gravida  0   Para  0   Term  0   Preterm  0   AB  0   Living  0     SAB  0   TAB  0   Ectopic  0   Multiple  0   Live Births               Home Medications    Prior to Admission medications   Medication Sig Start Date End Date Taking? Authorizing Provider  Cetirizine HCl 10 MG CAPS Take 1 capsule (10 mg total) by mouth daily for 15 days. 02/12/18 02/27/18  Wieters, Hallie C, PA-C  etonogestrel (NEXPLANON) 68 MG IMPL implant 1 each by Subdermal route once. Reported on 02/05/2016    [provider]  ibuprofen (ADVIL,MOTRIN) 800 MG tablet Take 1 tablet (800 mg total) by mouth 3 (three) times daily. 05/03/18   Eustace MooreNelson, Yvonne Sue, MD  Norgestimate-Ethinyl Estradiol Triphasic 0.18/0.215/0.25 MG-25 MCG tab Take 1 tablet by mouth daily. 04/02/18   Berton BonMikell, Asiyah Zahra, MD    Family History Family History  Problem Relation Age of Onset  . Early death Mother   . Stroke Maternal Grandmother   . Diabetes Maternal Uncle     Social History Social History   Tobacco Use  . Smoking status: Never Smoker  . Smokeless tobacco:  Never Used  Substance Use Topics  . Alcohol use: No  . Drug use: No     Allergies   Patient has no known allergies.   Review of Systems Review of Systems  HENT: Positive for sore throat.   Respiratory: Positive for shortness of breath.      Physical Exam Triage Vital Signs ED Triage Vitals  Enc Vitals Group     BP 06/22/18 1644 118/62     Pulse Rate 06/22/18 1644 60     Resp 06/22/18 1644 20     Temp 06/22/18 1644 98.9 F (37.2 C)     Temp Source 06/22/18 1644 Temporal     SpO2 06/22/18 1644 100 %     Weight --      Height --      Head Circumference --      Peak Flow --      Pain Score 06/22/18 1646 0     Pain Loc --      Pain Edu? --      Excl. in GC? --    No data  found.  Updated Vital Signs BP 118/62 (BP Location: Left Arm)   Pulse 60   Temp 98.9 F (37.2 C) (Temporal)   Resp 20   LMP 06/20/2018   SpO2 100%   Visual Acuity Right Eye Distance:   Left Eye Distance:   Bilateral Distance:    Right Eye Near:   Left Eye Near:    Bilateral Near:     Physical Exam  Constitutional: She appears well-developed and well-nourished. No distress.  HENT:  Head: Normocephalic and atraumatic.  Eyes: Conjunctivae are normal.  Neck: Neck supple.  Cardiovascular: Normal rate and regular rhythm.  No murmur heard. Pulmonary/Chest: Effort normal and breath sounds normal. No respiratory distress.  Abdominal: Soft. There is no tenderness.  Musculoskeletal: She exhibits no edema.  Neurological: She is alert.  Skin: Skin is warm and dry.  Psychiatric: She has a normal mood and affect.  Nursing note and vitals reviewed.    UC Treatments / Results  Labs (all labs ordered are listed, but only abnormal results are displayed) Labs Reviewed - No data to display  EKG None  Radiology No results found.  Procedures Procedures (including critical care time)  Medications Ordered in UC Medications - No data to display  Initial Impression / Assessment and Plan /  UC Course  I have reviewed the triage vital signs and the nursing notes.  Pertinent labs & imaging results that were available during my care of the patient were reviewed by me and considered in my medical decision making (see chart for details).     Sore throat, probably viral.  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  She is fairly anxious but does not take medication for same. Final Clinical Impressions(s) / UC Diagnoses   Final diagnoses:  None   Discharge Instructions   None    ED Prescriptions    None     Controlled Substance Prescriptions [place] Controlled Substance Registry consulted? No   Frederica Kuster, MD 06/22/18 [phone]

## 2018-06-22 NOTE — ED Triage Notes (Signed)
Pt presents with fever from having strep 3 weeks ago

## 2018-08-10 ENCOUNTER — Encounter (HOSPITAL_COMMUNITY): Payer: Self-pay

## 2018-08-10 ENCOUNTER — Other Ambulatory Visit: Payer: Self-pay

## 2018-08-10 ENCOUNTER — Emergency Department (HOSPITAL_COMMUNITY): Payer: PRIVATE HEALTH INSURANCE

## 2018-08-10 ENCOUNTER — Emergency Department (HOSPITAL_COMMUNITY)
Admission: EM | Admit: 2018-08-10 | Discharge: 2018-08-11 | Disposition: A | Discharge status: Home / Self Care | Payer: PRIVATE HEALTH INSURANCE | Attending: Emergency Medicine | Admitting: Emergency Medicine

## 2018-08-10 DIAGNOSIS — S61012A Laceration without foreign body of left thumb without damage to nail, initial encounter: Secondary | ICD-10-CM | POA: Diagnosis present

## 2018-08-10 DIAGNOSIS — Y999 Unspecified external cause status: Secondary | ICD-10-CM | POA: Insufficient documentation

## 2018-08-10 DIAGNOSIS — S61412A Laceration without foreign body of left hand, initial encounter: Secondary | ICD-10-CM

## 2018-08-10 DIAGNOSIS — S52512A Displaced fracture of left radial styloid process, initial encounter for closed fracture: Secondary | ICD-10-CM | POA: Diagnosis not present

## 2018-08-10 DIAGNOSIS — Y92410 Unspecified street and highway as the place of occurrence of the external cause: Secondary | ICD-10-CM | POA: Insufficient documentation

## 2018-08-10 DIAGNOSIS — S52514A Nondisplaced fracture of right radial styloid process, initial encounter for closed fracture: Secondary | ICD-10-CM

## 2018-08-10 DIAGNOSIS — Z79899 Other long term (current) drug therapy: Secondary | ICD-10-CM | POA: Diagnosis not present

## 2018-08-10 DIAGNOSIS — Y9389 Activity, other specified: Secondary | ICD-10-CM | POA: Insufficient documentation

## 2018-08-10 DIAGNOSIS — J45909 Unspecified asthma, uncomplicated: Secondary | ICD-10-CM | POA: Diagnosis not present
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
## 2018-08-11 DIAGNOSIS — S61012A Laceration without foreign body of left thumb without damage to nail, initial encounter: Secondary | ICD-10-CM | POA: Diagnosis not present

## 2018-08-11 MED ORDER — TRAMADOL HCL 50 MG PO TABS: 50.0000 mg | ORAL_TABLET | Freq: Four times a day (QID) | ORAL | 0 refills | Status: AC | PRN

## 2018-08-11 MED ORDER — LIDOCAINE HCL 2 % IJ SOLN
INTRAMUSCULAR | Status: AC
  Filled 2018-08-11: qty 20

## 2018-08-11 MED ORDER — LIDOCAINE HCL 2 % IJ SOLN: 20.0000 mL | Freq: Once | INTRAMUSCULAR | Status: DC

## 2018-08-11 MED ORDER — IBUPROFEN 800 MG PO TABS: 800.0000 mg | ORAL_TABLET | Freq: Three times a day (TID) | ORAL | 0 refills | Status: AC | PRN

## 2018-08-11 NOTE — ED Notes (Signed)
Ortho paged. 

## 2018-08-11 NOTE — ED Notes (Signed)
Pt stable and ambulatory for discharge, states understanding follow up.  

## 2018-08-11 NOTE — Discharge Instructions (Signed)
Keep the laceration clean and dry.  Follow-up with the orthopedist provided.  Return here for any worsening in your condition.
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
## 2019-10-20 ENCOUNTER — Ambulatory Visit: Payer: PRIVATE HEALTH INSURANCE

## 2019-11-08 ENCOUNTER — Telehealth: Payer: Self-pay | Admitting: Family Medicine

## 2019-11-08 NOTE — Telephone Encounter (Signed)
Attempted to contact patient with her appointment change due to a change in the office. No answer, left voicemail with the new appointment information. Patient instructed to give the office a call with any questions.

## 2019-11-10 ENCOUNTER — Ambulatory Visit: Payer: PRIVATE HEALTH INSURANCE

## 2019-11-25 ENCOUNTER — Ambulatory Visit: Payer: PRIVATE HEALTH INSURANCE

## 2022-04-10 ENCOUNTER — Ambulatory Visit: Payer: PRIVATE HEALTH INSURANCE | Admitting: Emergency Medicine

## 2022-04-23 ENCOUNTER — Encounter: Payer: Self-pay | Admitting: *Deleted
